# Patient Record
Sex: Female | Born: 2000 | Race: Black or African American | Hispanic: No | Marital: Single | Smoking: Never smoker
Health system: Southern US, Community
[De-identification: ages and names within clinical notes are randomized; demographics above are authoritative.]

## PROBLEM LIST (undated history)

## (undated) DIAGNOSIS — F32A Depression, unspecified: Secondary | ICD-10-CM

## (undated) DIAGNOSIS — F419 Anxiety disorder, unspecified: Secondary | ICD-10-CM

## (undated) DIAGNOSIS — F909 Attention-deficit hyperactivity disorder, unspecified type: Secondary | ICD-10-CM

## (undated) HISTORY — PX: BREAST BIOPSY: SHX20

---
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
## 2021-01-29 ENCOUNTER — Other Ambulatory Visit: Payer: Self-pay

## 2021-01-29 ENCOUNTER — Ambulatory Visit: Payer: BC Managed Care – PPO | Admitting: Physical Therapy

## 2021-01-29 ENCOUNTER — Encounter: Payer: Self-pay | Admitting: Physical Therapy

## 2021-01-29 DIAGNOSIS — R29898 Other symptoms and signs involving the musculoskeletal system: Secondary | ICD-10-CM

## 2021-01-29 DIAGNOSIS — M6281 Muscle weakness (generalized): Secondary | ICD-10-CM | POA: Diagnosis not present

## 2021-01-29 NOTE — Therapy (Signed)
[place], [place], [address] Phone: [phone]   Fax:  [phone]  Physical Therapy Evaluation  Patient Details  Name: Evelyn Booker MRN: [id number] Date of Birth: 2000/09/01 Referring Provider (PT): Park Meo   Encounter Date: 01/29/2021   PT End of Session - 01/29/21 1051     Visit Number 1    Number of Visits 6    Date for PT Re-Evaluation 03/12/21    PT Start Time 6333    PT Stop Time 1045    PT Time Calculation (min) 30 min    Activity Tolerance Patient tolerated treatment well    Behavior During Therapy [facility] for tasks assessed/performed             History reviewed. No pertinent past medical history.  History reviewed. No pertinent surgical history.  There were no vitals filed for this visit.    Subjective Assessment - 01/29/21 1018     Subjective Pt had a patellar dislocation about 2 years ago and only was able to complete a few sessions before "life got in the way". She states that she continues to be nervous that she may dislocate her patella again and wants to make sure she is doing all she can do to prevent that from happening. No c/o knee pain, she just feels some "popping" occasionally.    Pertinent History Patellar dislocation 2021    Diagnostic tests none recently    Patient Stated Goals improve strength to prevent another patellar dislocation    Currently in Pain? 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      Observation/Other Assessments   Focus on Therapeutic Outcomes (FOTO)  not performed      Functional Tests   Functional tests Squat;Single leg stance      Squat   Comments normal patellar tracking      Single Leg Stance   Comments > 30 sec bilat      ROM / Strength   AROM / PROM / Strength AROM;Strength      AROM   AROM Assessment Site Knee    Right/Left Knee Right;Left    Right Knee Extension 0    Right Knee Flexion 134    Left Knee Extension 0    Left Knee Flexion 135      Strength   Strength Assessment Site Knee;Hip    Right/Left Hip Right;Left    Right Hip Flexion 3+/5    Right Hip Extension 3+/5    Right Hip ABduction 3+/5    Left Hip Flexion 3+/5    Left Hip ABduction 4-/5    Right/Left Knee Right;Left    Right Knee Flexion 3+/5    Right Knee Extension 3+/5    Left Knee Flexion  3+/5      Flexibility   Soft Tissue Assessment /Muscle Length yes    Hamstrings decreased bilat    ITB decreased bilat      Palpation   Patella mobility Rt hypermobile                        Objective measurements completed on examination: See above findings.       [place] Adult PT Treatment/Exercise - 01/29/21 0001       Exercises   Exercises Knee/Hip      Knee/Hip Exercises: Stretches   Passive Hamstring Stretch Right;Left;30 seconds    Passive Hamstring Stretch Limitations with strap    ITB Stretch Left;Right;30 seconds    ITB Stretch Limitations with strap      Knee/Hip Exercises: Standing   Step Down Right;Hand Hold: 1;10 reps;Step Height: 6"    Step Down Limitations lateral    Other Standing Knee Exercises sidestep red TB 3 x 10 bilat      Knee/Hip Exercises: Supine   Straight Leg Raises 10 reps;Right    Straight Leg Raise with External Rotation Right;10 reps      Knee/Hip Exercises: Sidelying   Hip ABduction Right;10 reps                     PT Education - 01/29/21 1040      Education Details PT POC and goals, HEP    Person(s) Educated Patient    Methods Explanation;Demonstration;Handout    Comprehension Returned demonstration;Verbalized understanding                 PT Long Term Goals - 01/29/21 1101       PT LONG TERM GOAL #1   Title Pt will be independent in HEP    Time 6    Period Weeks    Status New    Target Date 03/12/21      PT LONG TERM GOAL #2   Title Pt will improve bilat LE strength to 4+/5 to reduce risk of patella dislocation    Time 6    Period Weeks    Status New    Target Date 03/12/21                    Plan - 01/29/21 1052     Clinical Impression Statement Pt is a 20 y/o female referred s/p Rt patella dislocation. Pt presents with decreased hip and knee strength and flexibility and impaired coordination and will benefit from skilled PT to address deficits and improve functional mobility    Personal Factors and Comorbidities Time since onset of injury/illness/exacerbation;Past/Current Experience    Examination-Participation Restrictions Occupation;Community Activity    Stability/Clinical Decision Making Stable/Uncomplicated    Clinical Decision Making Low    Rehab Potential Good    PT Frequency 1x / week    PT Duration 6 weeks    PT Treatment/Interventions Taping;Dry needling;Manual techniques;Patient/family education;Therapeutic exercise;Therapeutic activities;Balance training;Neuromuscular re-education;Gait training;Stair training;Moist Heat;Cryotherapy;Vasopneumatic Device;Aquatic Therapy;Iontophoresis 4mg /ml Dexamethasone;Electrical Stimulation    PT Next Visit Plan assess and progress HEP    PT Home Exercise Plan [email or web address]    Consulted and Agree with Plan of Care Patient             Patient will benefit from skilled therapeutic intervention in order to improve the following deficits and impairments:  Impaired flexibility, Decreased strength  Visit Diagnosis: Muscle weakness (generalized) - Plan: PT  plan of care cert/re-cert  Other symptoms and signs involving the musculoskeletal system - Plan: PT plan of care cert/re-cert     Problem List Patient Active Problem List   Diagnosis Date Noted   Hypermobility syndrome 10/11/2018   Dislocation of right patella 10/11/2018    Evelyn Booker, PT 01/29/2021, 11:05 AM  [facility] [place], [place], [address] Phone: [phone]   Fax:  [phone]  Name: Evelyn Booker MRN: [id number] Date of Birth: 2000-05-06

## 2021-01-29 NOTE — Patient Instructions (Signed)
Access Code: [email or web address] URL: [email or web address] Date: 01/29/2021 Prepared by: Isabelle Course  Exercises Hooklying Hamstring Stretch with Strap - 1 x daily - 7 x weekly - 3 sets - 1 reps - 20-30 seconds hold Supine ITB Stretch with Strap - 1 x daily - 7 x weekly - 3 sets - 1 reps - 20-30 seconds hold Active Straight Leg Raise with Quad Set - 1 x daily - 7 x weekly - 3 sets - 10 reps Straight Leg Raise with External Rotation - 1 x daily - 7 x weekly - 3 sets - 10 reps Sidelying Hip Abduction - 1 x daily - 7 x weekly - 3 sets - 10 reps Side Stepping with Resistance at Feet - 1 x daily - 7 x weekly - 3 sets - 10 reps Lateral Step Down - 1 x daily - 7 x weekly - 3 sets - 10 reps

## 2021-02-11 ENCOUNTER — Encounter: Payer: BC Managed Care – PPO | Admitting: Physical Therapy

## 2021-07-05 IMAGING — DX DG KNEE COMPLETE 4+V*R*
4 series · 4 of 4 positions shown · non-contrast
Comparison: None.

CLINICAL DATA: Acute RIGHT knee pain following fall. Reported
patellar dislocation.

EXAM:
RIGHT KNEE - COMPLETE 4+ VIEW

[knee ap]
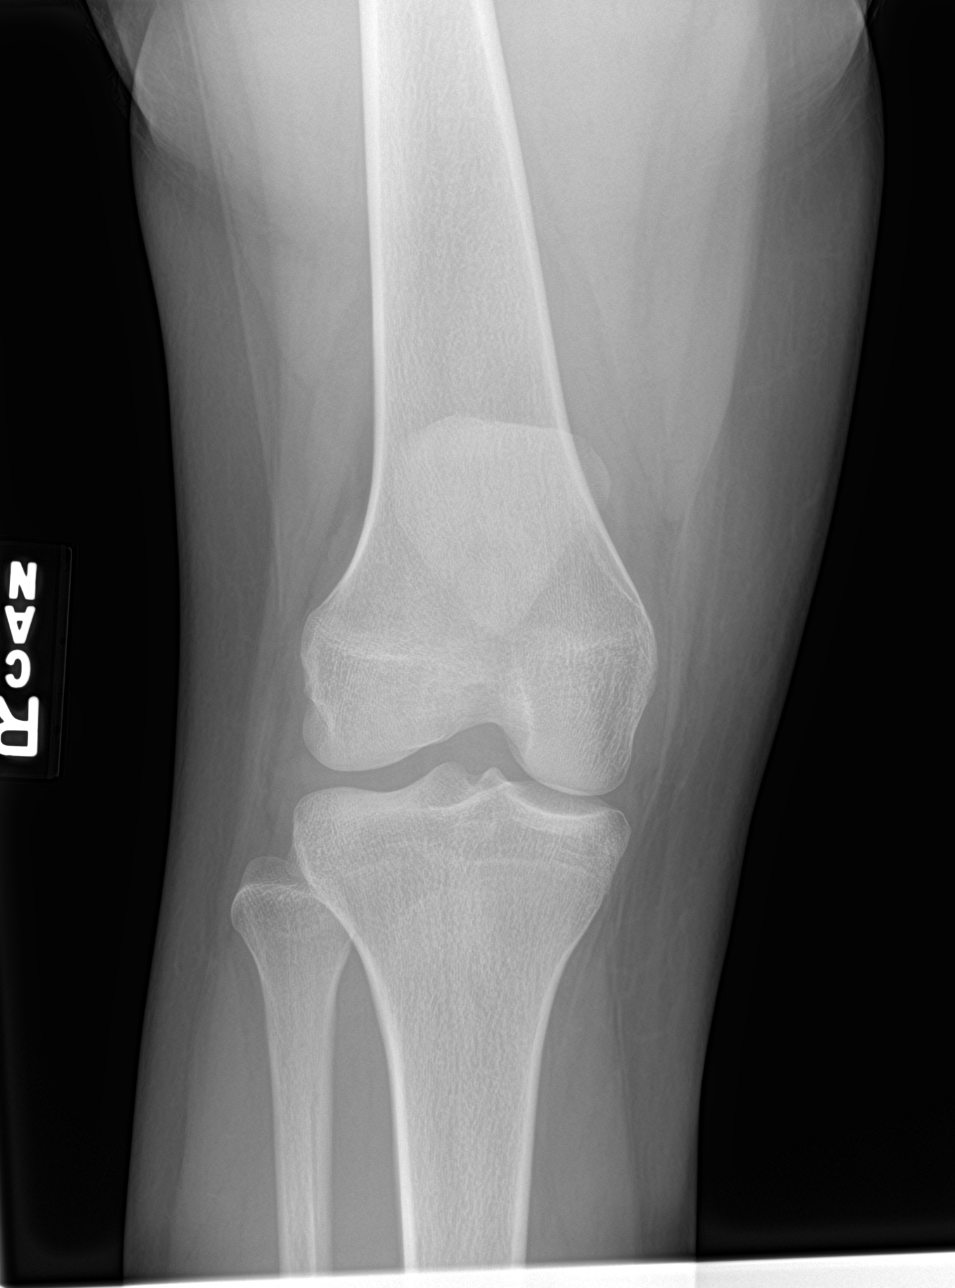

[knee lat]
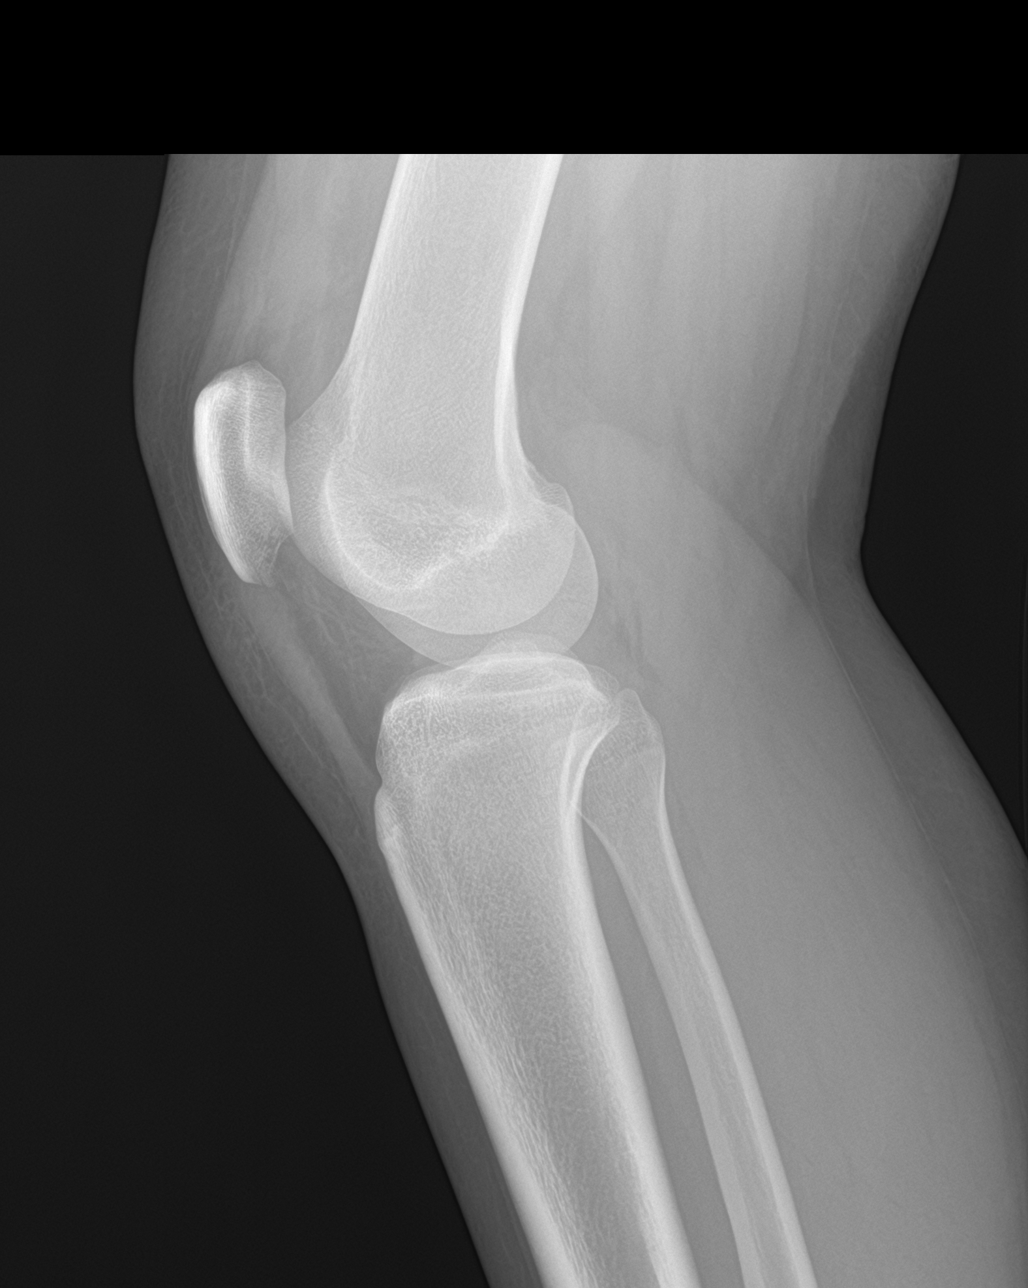

[knee obl (1 of 2)]
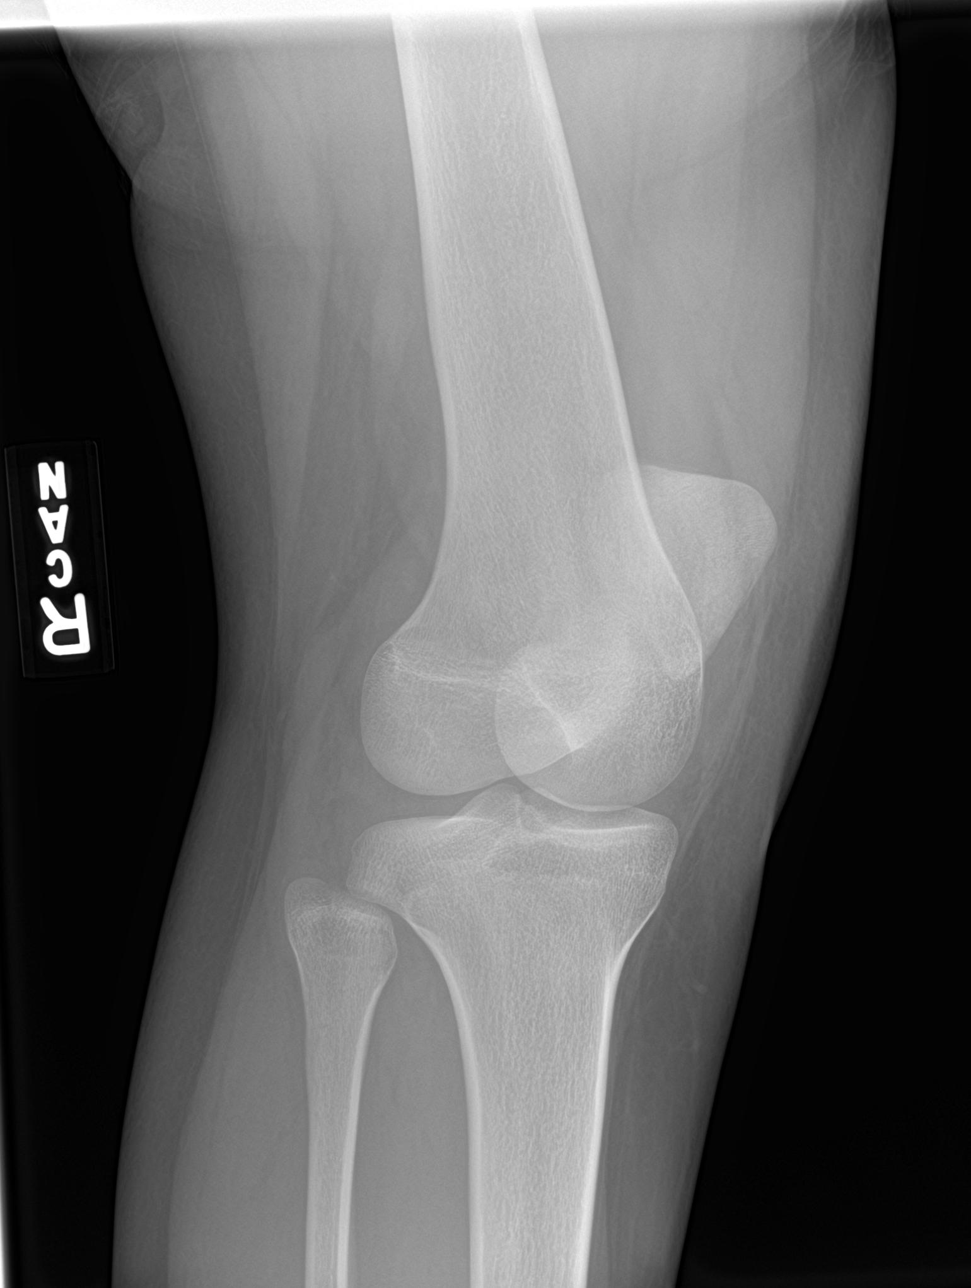

[knee obl (2 of 2)]
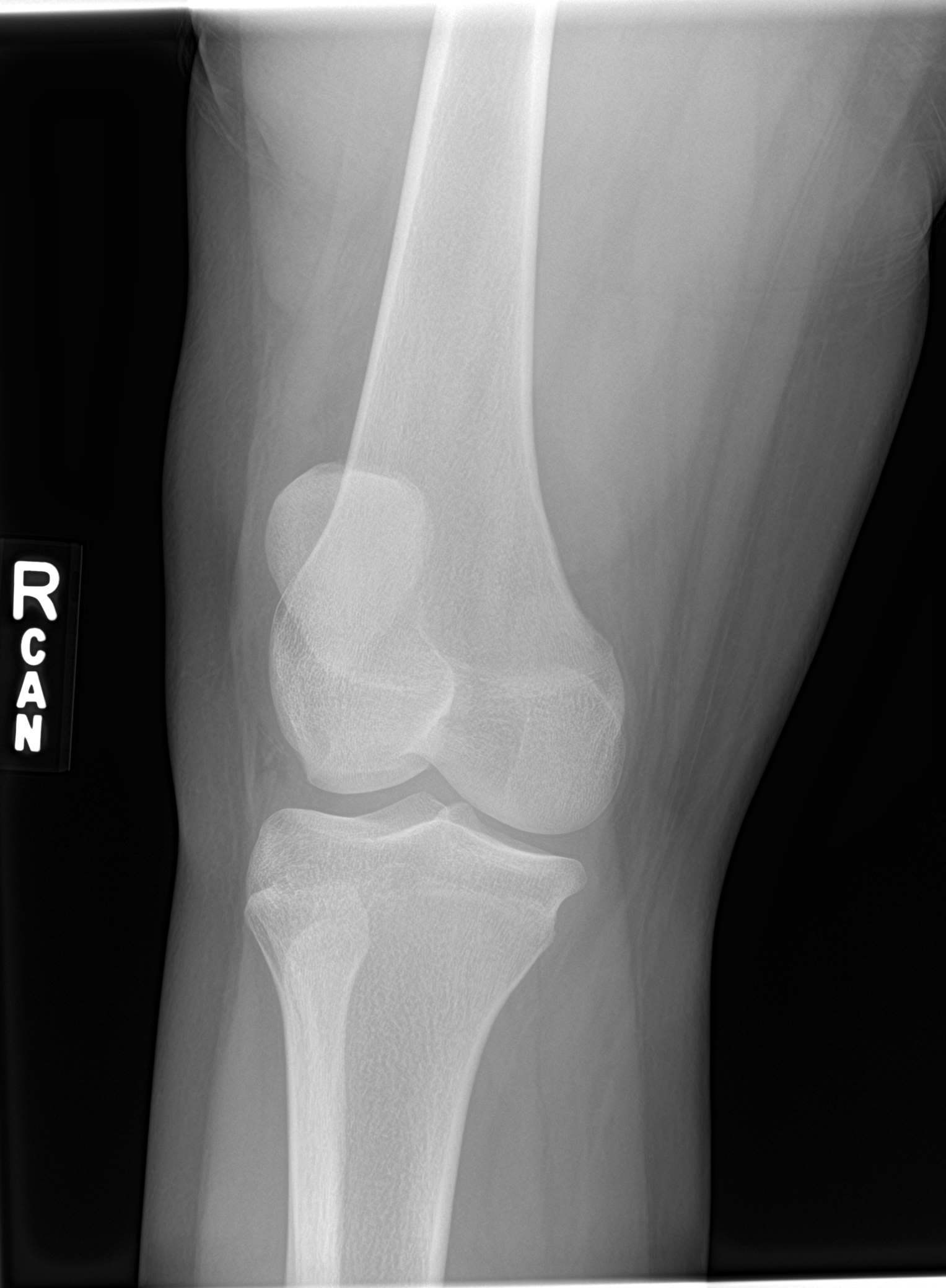

[4 of 4 positions shown; findings below may reference images not displayed]

FINDINGS: No fracture or definite dislocation identified on this study.

A knee effusion is present.

No focal bony abnormalities are present.
IMPRESSION: Knee effusion without definite acute bony abnormality.

## 2021-07-06 IMAGING — DX DG KNEE 1-2V*R*
1 series · 2 of 2 positions shown · non-contrast
Comparison: 10/10/2018

CLINICAL DATA: Knee pain

EXAM:
RIGHT KNEE - 1-2 VIEW

[Series 4: knee sunrise · 0.14mm/px · 2 of 2 slices shown]
[im 1/2]
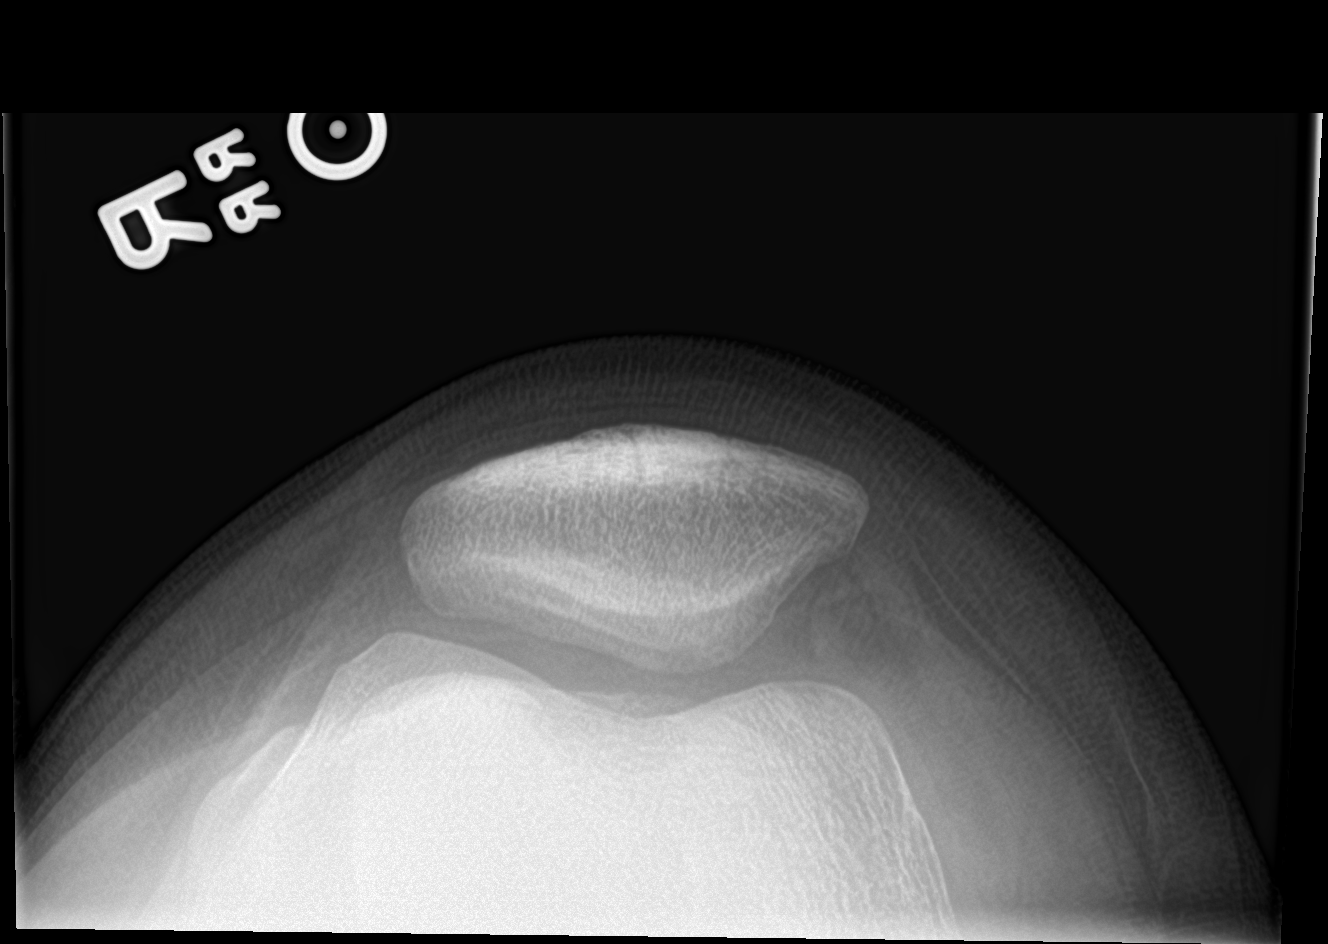
[im 2/2]
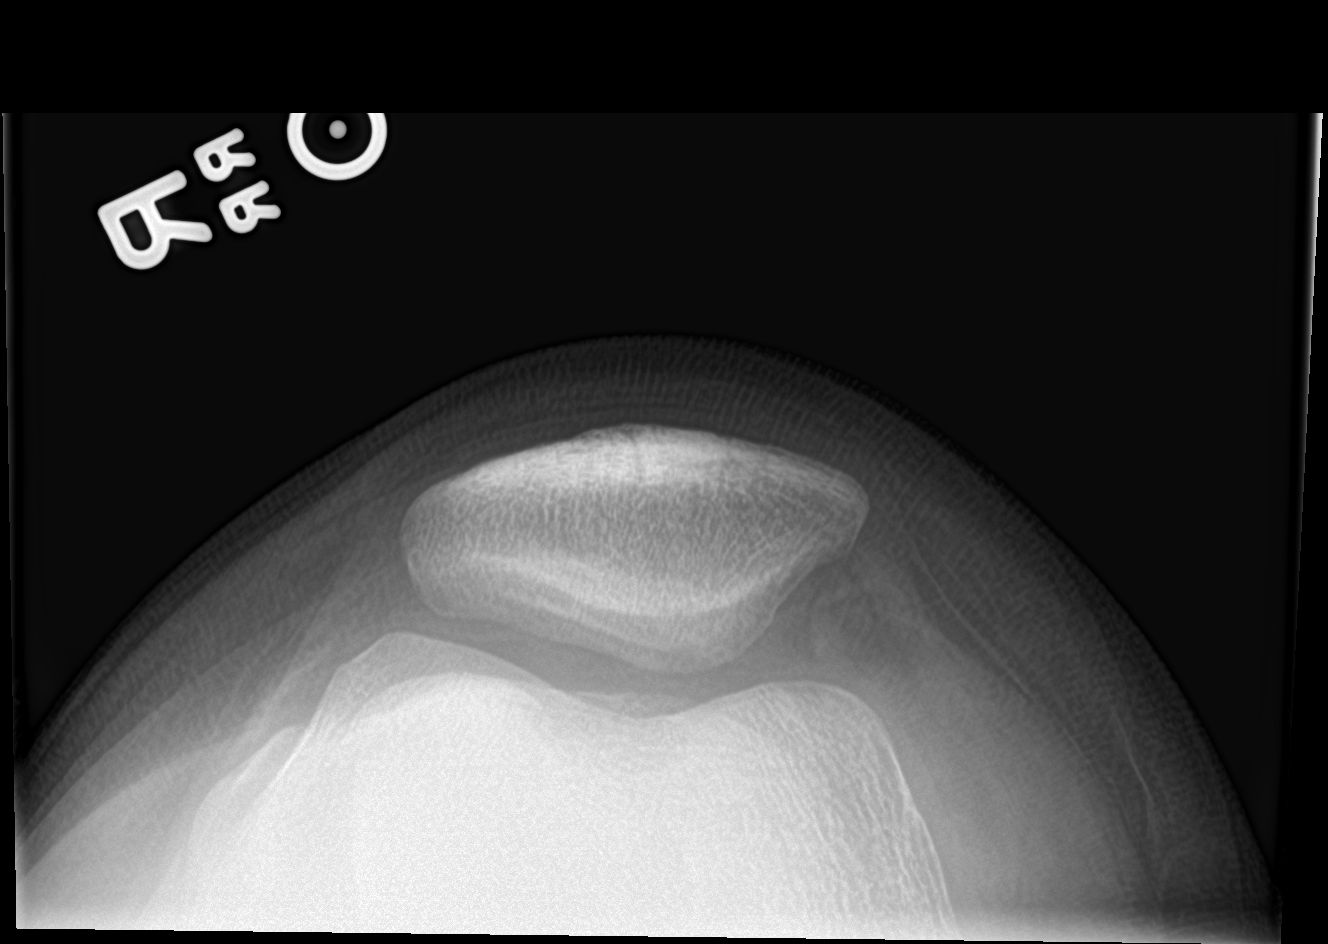

[2 of 2 positions shown; findings below may reference images not displayed]

FINDINGS: Sunrise views of the patella.  Alignment is normal.  No dislocation
IMPRESSION: Negative.

## 2022-05-26 ENCOUNTER — Encounter: Payer: Self-pay | Admitting: *Deleted

## 2022-06-01 ENCOUNTER — Emergency Department (EMERGENCY_DEPARTMENT_HOSPITAL)
Admission: EM | Admit: 2022-06-01 | Discharge: 2022-06-02 | Disposition: A | Discharge status: Other Acute Inpatient Hospital | Payer: BC Managed Care – PPO | Source: Home / Self Care | Attending: Emergency Medicine | Admitting: Emergency Medicine

## 2022-06-01 DIAGNOSIS — R45851 Suicidal ideations: Secondary | ICD-10-CM | POA: Insufficient documentation

## 2022-06-01 DIAGNOSIS — T1491XA Suicide attempt, initial encounter: Secondary | ICD-10-CM | POA: Diagnosis not present

## 2022-06-01 DIAGNOSIS — Z20822 Contact with and (suspected) exposure to covid-19: Secondary | ICD-10-CM | POA: Insufficient documentation

## 2022-06-01 DIAGNOSIS — F439 Reaction to severe stress, unspecified: Secondary | ICD-10-CM | POA: Insufficient documentation

## 2022-06-01 DIAGNOSIS — R45850 Homicidal ideations: Secondary | ICD-10-CM | POA: Insufficient documentation

## 2022-06-01 DIAGNOSIS — F332 Major depressive disorder, recurrent severe without psychotic features: Secondary | ICD-10-CM | POA: Diagnosis present

## 2022-06-01 DIAGNOSIS — F3181 Bipolar II disorder: Secondary | ICD-10-CM | POA: Diagnosis not present

## 2022-06-01 DIAGNOSIS — E876 Hypokalemia: Secondary | ICD-10-CM | POA: Insufficient documentation

## 2022-06-01 DIAGNOSIS — F411 Generalized anxiety disorder: Secondary | ICD-10-CM | POA: Diagnosis present

## 2022-06-01 DIAGNOSIS — F419 Anxiety disorder, unspecified: Secondary | ICD-10-CM | POA: Diagnosis present

## 2022-06-02 ENCOUNTER — Inpatient Hospital Stay (HOSPITAL_COMMUNITY)
Admission: AD | Admit: 2022-06-02 | Discharge: 2022-06-09 | DRG: 885 | Disposition: A | Discharge status: Home / Self Care | Payer: BC Managed Care – PPO | Source: Intra-hospital | Attending: Psychiatry | Admitting: Psychiatry

## 2022-06-02 ENCOUNTER — Encounter (HOSPITAL_COMMUNITY): Payer: Self-pay | Admitting: Psychiatry

## 2022-06-02 ENCOUNTER — Encounter (HOSPITAL_COMMUNITY): Payer: Self-pay | Admitting: *Deleted

## 2022-06-02 ENCOUNTER — Other Ambulatory Visit: Payer: Self-pay

## 2022-06-02 DIAGNOSIS — R45851 Suicidal ideations: Secondary | ICD-10-CM | POA: Diagnosis present

## 2022-06-02 DIAGNOSIS — K30 Functional dyspepsia: Secondary | ICD-10-CM | POA: Diagnosis present

## 2022-06-02 DIAGNOSIS — F411 Generalized anxiety disorder: Secondary | ICD-10-CM | POA: Diagnosis present

## 2022-06-02 DIAGNOSIS — R000 Tachycardia, unspecified: Secondary | ICD-10-CM | POA: Diagnosis present

## 2022-06-02 DIAGNOSIS — T1491XA Suicide attempt, initial encounter: Secondary | ICD-10-CM | POA: Diagnosis not present

## 2022-06-02 DIAGNOSIS — Z20822 Contact with and (suspected) exposure to covid-19: Secondary | ICD-10-CM | POA: Diagnosis present

## 2022-06-02 DIAGNOSIS — Z79899 Other long term (current) drug therapy: Secondary | ICD-10-CM | POA: Diagnosis not present

## 2022-06-02 DIAGNOSIS — R45850 Homicidal ideations: Secondary | ICD-10-CM | POA: Diagnosis present

## 2022-06-02 DIAGNOSIS — F3181 Bipolar II disorder: Secondary | ICD-10-CM | POA: Diagnosis not present

## 2022-06-02 DIAGNOSIS — G4700 Insomnia, unspecified: Secondary | ICD-10-CM | POA: Diagnosis present

## 2022-06-02 DIAGNOSIS — F909 Attention-deficit hyperactivity disorder, unspecified type: Secondary | ICD-10-CM | POA: Diagnosis present

## 2022-06-02 DIAGNOSIS — F332 Major depressive disorder, recurrent severe without psychotic features: Secondary | ICD-10-CM | POA: Diagnosis present

## 2022-06-02 DIAGNOSIS — K5900 Constipation, unspecified: Secondary | ICD-10-CM | POA: Diagnosis present

## 2022-06-02 DIAGNOSIS — F419 Anxiety disorder, unspecified: Secondary | ICD-10-CM | POA: Diagnosis present

## 2022-06-02 DIAGNOSIS — R454 Irritability and anger: Secondary | ICD-10-CM | POA: Diagnosis present

## 2022-06-02 HISTORY — DX: Depression, unspecified: F32.A

## 2022-06-02 HISTORY — DX: Attention-deficit hyperactivity disorder, unspecified type: F90.9

## 2022-06-02 HISTORY — DX: Anxiety disorder, unspecified: F41.9

## 2022-06-02 LAB — COMPREHENSIVE METABOLIC PANEL
ALT: 9 U/L (ref 0–44)
AST: 15 U/L (ref 15–41)
Albumin: 4 g/dL (ref 3.5–5.0)
Alkaline Phosphatase: 42 U/L (ref 38–126)
Anion gap: 10 (ref 5–15)
BUN: 7 mg/dL (ref 6–20)
CO2: 21 mmol/L — ABNORMAL LOW (ref 22–32)
Calcium: 9 mg/dL (ref 8.9–10.3)
Chloride: 105 mmol/L (ref 98–111)
Creatinine, Ser: 0.81 mg/dL (ref 0.44–1.00)
GFR, Estimated: >60 mL/min (ref >60)
Glucose, Bld: 137 mg/dL — ABNORMAL HIGH (ref 70–99)
Potassium: 3.3 mmol/L — ABNORMAL LOW (ref 3.5–5.1)
Sodium: 136 mmol/L (ref 135–145)
Total Bilirubin: 0.8 mg/dL (ref 0.3–1.2)
Total Protein: 7.1 g/dL (ref 6.5–8.1)

## 2022-06-02 LAB — CBC WITH DIFFERENTIAL/PLATELET
Abs Immature Granulocytes: 0.02 10*3/uL (ref 0.00–0.07)
Basophils Absolute: 0 10*3/uL (ref 0.0–0.1)
Basophils Relative: 1 %
Eosinophils Absolute: 0.1 10*3/uL (ref 0.0–0.5)
Eosinophils Relative: 1 %
HCT: 39.8 % (ref 36.0–46.0)
Hemoglobin: 12.2 g/dL (ref 12.0–15.0)
Immature Granulocytes: 0 %
Lymphocytes Relative: 24 %
Lymphs Abs: 1.4 10*3/uL (ref 0.7–4.0)
MCH: 26.9 pg (ref 26.0–34.0)
MCHC: 30.7 g/dL (ref 30.0–36.0)
MCV: 87.9 fL (ref 80.0–100.0)
Monocytes Absolute: 0.4 10*3/uL (ref 0.1–1.0)
Monocytes Relative: 7 %
Neutro Abs: 3.9 10*3/uL (ref 1.7–7.7)
Neutrophils Relative %: 67 %
Platelets: 235 10*3/uL (ref 150–400)
RBC: 4.53 MIL/uL (ref 3.87–5.11)
RDW: 13.3 % (ref 11.5–15.5)
WBC: 5.7 10*3/uL (ref 4.0–10.5)
nRBC: 0 % (ref 0.0–0.2)

## 2022-06-02 LAB — COOXEMETRY PANEL
Carboxyhemoglobin: <0.3 % — ABNORMAL LOW (ref 0.5–1.5)
Methemoglobin: <0.7 % (ref 0.0–1.5)
O2 Saturation: 27.7 %
Total hemoglobin: 12.8 g/dL (ref 12.0–16.0)

## 2022-06-02 LAB — SARS CORONAVIRUS 2 BY RT PCR: SARS Coronavirus 2 by RT PCR: NEGATIVE

## 2022-06-02 LAB — ACETAMINOPHEN LEVEL: Acetaminophen (Tylenol), Serum: <10 ug/mL — ABNORMAL LOW (ref 10–30)

## 2022-06-02 LAB — RAPID URINE DRUG SCREEN, HOSP PERFORMED
Amphetamines: NOT DETECTED
Barbiturates: NOT DETECTED
Benzodiazepines: NOT DETECTED
Cocaine: NOT DETECTED
Opiates: NOT DETECTED
Tetrahydrocannabinol: POSITIVE — AB

## 2022-06-02 LAB — HCG, QUANTITATIVE, PREGNANCY: hCG, Beta Chain, Quant, S: <1 m[IU]/mL (ref <5)

## 2022-06-02 LAB — SALICYLATE LEVEL: Salicylate Lvl: <7 mg/dL — ABNORMAL LOW (ref 7.0–30.0)

## 2022-06-02 LAB — ETHANOL: Alcohol, Ethyl (B): <10 mg/dL (ref <10)

## 2022-06-02 MED ORDER — MAGNESIUM HYDROXIDE 400 MG/5ML PO SUSP
30.0000 mL | Freq: Every day | ORAL | Status: DC | PRN
  Administered 2022-06-06 – 2022-06-07 (×2): 30 mL via ORAL
  Filled 2022-06-02 (×3): qty 30

## 2022-06-02 MED ORDER — ACETAMINOPHEN 325 MG PO TABS
650.0000 mg | ORAL_TABLET | Freq: Four times a day (QID) | ORAL | Status: DC | PRN
  Administered 2022-06-02 – 2022-06-03 (×2): 650 mg via ORAL
  Filled 2022-06-02 (×3): qty 2

## 2022-06-02 MED ORDER — HALOPERIDOL 5 MG PO TABS: 5.0000 mg | ORAL_TABLET | Freq: Three times a day (TID) | ORAL | Status: DC | PRN

## 2022-06-02 MED ORDER — ESCITALOPRAM OXALATE 10 MG PO TABS
5.0000 mg | ORAL_TABLET | Freq: Every day | ORAL | Status: DC
  Administered 2022-06-02: 5 mg via ORAL
  Filled 2022-06-02: qty 1

## 2022-06-02 MED ORDER — HYDROXYZINE HCL 10 MG PO TABS: 10.0000 mg | ORAL_TABLET | Freq: Three times a day (TID) | ORAL | Status: DC | PRN

## 2022-06-02 MED ORDER — ALUM & MAG HYDROXIDE-SIMETH 200-200-20 MG/5ML PO SUSP
30.0000 mL | ORAL | Status: DC | PRN
  Administered 2022-06-06: 30 mL via ORAL
  Filled 2022-06-02: qty 30

## 2022-06-02 MED ORDER — DIPHENHYDRAMINE HCL 25 MG PO CAPS: 50.0000 mg | ORAL_CAPSULE | Freq: Three times a day (TID) | ORAL | Status: DC | PRN

## 2022-06-02 MED ORDER — DIPHENHYDRAMINE HCL 50 MG/ML IJ SOLN: 50.0000 mg | Freq: Three times a day (TID) | INTRAMUSCULAR | Status: DC | PRN

## 2022-06-02 MED ORDER — TRAZODONE HCL 50 MG PO TABS
50.0000 mg | ORAL_TABLET | Freq: Every evening | ORAL | Status: DC | PRN
  Administered 2022-06-03 – 2022-06-08 (×5): 50 mg via ORAL
  Filled 2022-06-02 (×6): qty 1

## 2022-06-02 MED ORDER — HALOPERIDOL LACTATE 5 MG/ML IJ SOLN: 5.0000 mg | Freq: Three times a day (TID) | INTRAMUSCULAR | Status: DC | PRN

## 2022-06-02 MED ORDER — LORAZEPAM 2 MG/ML IJ SOLN: 2.0000 mg | Freq: Three times a day (TID) | INTRAMUSCULAR | Status: DC | PRN

## 2022-06-02 MED ORDER — HYDROXYZINE HCL 25 MG PO TABS
25.0000 mg | ORAL_TABLET | Freq: Three times a day (TID) | ORAL | Status: DC | PRN
  Administered 2022-06-03 – 2022-06-08 (×9): 25 mg via ORAL
  Filled 2022-06-02 (×10): qty 1

## 2022-06-02 MED ORDER — WHITE PETROLATUM EX OINT
TOPICAL_OINTMENT | CUTANEOUS | Status: AC
  Filled 2022-06-02: qty 5

## 2022-06-02 MED ORDER — LORAZEPAM 1 MG PO TABS: 2.0000 mg | ORAL_TABLET | Freq: Three times a day (TID) | ORAL | Status: DC | PRN

## 2022-06-02 NOTE — ED Notes (Signed)
EKG being performed on pt for transfer to [facility]

## 2022-06-02 NOTE — Plan of Care (Signed)
  Problem: Education: Goal: Ability to make informed decisions regarding treatment will improve Outcome: Progressing   Problem: Coping: Goal: Coping ability will improve Outcome: Progressing   Problem: Health Behavior/Discharge Planning: Goal: Identification of resources available to assist in meeting health care needs will improve Outcome: Progressing   Problem: Medication: Goal: Compliance with prescribed medication regimen will improve Outcome: Progressing   Problem: Self-Concept: Goal: Ability to disclose and discuss suicidal ideas will improve Outcome: Progressing Goal: Will verbalize positive feelings about self Outcome: Progressing   
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
## 2022-06-02 NOTE — ED Notes (Signed)
Pt has been changed into blue paper scrubs since we didn't have any burgundy scrubs. Pt has two belonging bags one with her clothes and shoes, the other one has her carry on bag. Pt has been wanded by security.
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
## 2022-06-02 NOTE — ED Triage Notes (Signed)
Pt arrived with a friend. Reports over the last 2 weeks has had suicidal thoughts. Tonight took a gas heater into her car and turned the heater on in attempt to commit suicide. Denies new medications or psychiatric admissions; however, is hesitant about new stressors in regards to attempt. No complaints regarding carbon monoxide

## 2022-06-02 NOTE — Group Note (Signed)
LCSW Group Therapy Note   Group Date: 06/02/2022 Start Time: 1100 End Time: 1200     Type of Therapy and Topic:  Group Therapy:  Goal Exploration   Participation Level:  Did Not Attend     Description of Group: In this process group, patients discussed using strengths to work toward goals and address challenges.  Patients identified two positive things about themselves and one goal they were working on.  Patients were given the opportunity to share openly and support each other's plan for self-empowerment.  The group discussed the value of gratitude and were encouraged to have a daily reflection of positive characteristics or circumstances.  Patients were encouraged to identify a plan to utilize their strengths to work on current challenges and goals.   Therapeutic Goals Patient will verbalize personal strengths/positive qualities and relate how these can assist with achieving desired personal goals Patients will verbalize affirmation of peers plans for personal change and goal setting Patients will explore the value of gratitude and positive focus as related to successful achievement of goals Patients will verbalize a plan for regular reinforcement of personal positive qualities and circumstances.   Summary of Patient Progress:  Did Not Attend        Therapeutic Modalities Cognitive Behavioral Therapy Motivational Interviewing     Evelyn Booker 06/02/2022  3:16 PM

## 2022-06-02 NOTE — Progress Notes (Signed)
Pt was accepted to [facility] TODAY 06/02/2022, pending EKG and signed voluntary consent faxed to [phone]. Bed assignment: 405-1  Pt meets inpatient criteria per Dahlia Byes, NP  Attending Physician will be Phineas Inches, MD  Report can be called to: - Adult unit: [phone]  Pt can arrive after pending items are received  Care Team Notified: [facility] Rona Ravens, RN, Dahlia Byes, NP, Will Phillips Odor, RN, and [facility], NT  [place], [place]  06/02/2022 11:11 AM
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
## 2022-06-02 NOTE — Group Note (Signed)
LCSW Group Therapy Note  Group Date: 06/02/2022 Start Time: 1100 End Time: 1155   Type of Therapy and Topic:  Group Therapy - How To Cope with Nervousness about Discharge   Participation Level:  Active   Description of Group This process group involved identification of patients' feelings about discharge. Some of them are scheduled to be discharged soon, while others are new admissions, but each of them was asked to share thoughts and feelings surrounding discharge from the hospital. One common theme was that they are excited at the prospect of going home, while another was that many of them are apprehensive about sharing why they were hospitalized. Patients were given the opportunity to discuss these feelings with their peers in preparation for discharge.  Therapeutic Goals  Patient will identify their overall feelings about pending discharge. Patient will think about how they might proactively address issues that they believe will once again arise once they get home (i.e. with parents). Patients will participate in discussion about having hope for change.   Summary of Patient Progress:  Evelyn Booker was very active throughout the session. Evelyn Booker demonstrated proper insight into the subject matter, and proved open to input from peers and feedback from CSW. Evelyn Booker was respectful of peers and participated throughout the entire session.   Therapeutic Modalities Cognitive Behavioral Therapy   Ane Payment, LCSW 06/02/2022  1:16 PM

## 2022-06-03 ENCOUNTER — Encounter (HOSPITAL_COMMUNITY): Payer: Self-pay

## 2022-06-03 DIAGNOSIS — R454 Irritability and anger: Secondary | ICD-10-CM | POA: Diagnosis present

## 2022-06-03 DIAGNOSIS — G4700 Insomnia, unspecified: Secondary | ICD-10-CM | POA: Diagnosis present

## 2022-06-03 DIAGNOSIS — F3181 Bipolar II disorder: Secondary | ICD-10-CM | POA: Diagnosis not present

## 2022-06-03 MED ORDER — POTASSIUM CHLORIDE CRYS ER 20 MEQ PO TBCR
40.0000 meq | EXTENDED_RELEASE_TABLET | Freq: Once | ORAL | Status: AC
  Administered 2022-06-03: 40 meq via ORAL
  Filled 2022-06-03 (×2): qty 2

## 2022-06-03 MED ORDER — FLUOXETINE HCL 10 MG PO CAPS
10.0000 mg | ORAL_CAPSULE | Freq: Every day | ORAL | Status: DC
  Administered 2022-06-04 – 2022-06-08 (×5): 10 mg via ORAL
  Filled 2022-06-03 (×7): qty 1

## 2022-06-03 MED ORDER — ARIPIPRAZOLE 5 MG PO TABS
5.0000 mg | ORAL_TABLET | Freq: Every day | ORAL | Status: DC
  Administered 2022-06-03 – 2022-06-09 (×7): 5 mg via ORAL
  Filled 2022-06-03 (×9): qty 1

## 2022-06-03 MED ORDER — LAMOTRIGINE 25 MG PO TABS
25.0000 mg | ORAL_TABLET | Freq: Every day | ORAL | Status: DC
  Administered 2022-06-04 – 2022-06-05 (×2): 25 mg via ORAL
  Filled 2022-06-03 (×4): qty 1

## 2022-06-03 NOTE — Progress Notes (Signed)
Patient endorses passive SI with no plan or intent. Denies HI and A/V/H. Patient states her anxiety is a 1 out of 10 and depression 8 out of 10. Patient med compliant and cooperative on unit.      06/03/22 1016  Psych Admission Type (Psych Patients Only)  Admission Status Voluntary  Psychosocial Assessment  Patient Complaints Depression  Eye Contact Fair  Facial Expression Anxious  Affect Depressed  Speech Logical/coherent  Interaction Assertive  Motor Activity Fidgety  Appearance/Hygiene Unremarkable  Behavior Characteristics Cooperative  Mood Depressed  Thought Process  Coherency WDL  Content WDL  Delusions None reported or observed  Perception WDL  Hallucination None reported or observed  Judgment Poor  Confusion None  Danger to Self  Current suicidal ideation? Passive  Agreement Not to Harm Self Yes  Description of Agreement verbally contracts for safety  Danger to Others  Danger to Others None reported or observed

## 2022-06-03 NOTE — BH IP Treatment Plan (Signed)
Interdisciplinary Treatment and Diagnostic Plan Update  06/03/2022 Time of Session: 10:45 AM  Evelyn Booker MRN: [id number]  Principal Diagnosis: MDD (major depressive disorder), recurrent episode, severe  Secondary Diagnoses: Principal Problem:   MDD (major depressive disorder), recurrent episode, severe   Current Medications:  Current Facility-Administered Medications  Medication Dose Route Frequency Provider Last Rate Last Admin   acetaminophen (TYLENOL) tablet 650 mg  650 mg Oral Q6H PRN Starleen Blue, NP   650 mg at 06/03/22 1013   alum & mag hydroxide-simeth (MAALOX/MYLANTA) 200-200-20 MG/5ML suspension 30 mL  30 mL Oral Q4H PRN Nkwenti, Doris, NP       diphenhydrAMINE (BENADRYL) capsule 50 mg  50 mg Oral TID PRN Starleen Blue, NP       Or   diphenhydrAMINE (BENADRYL) injection 50 mg  50 mg Intramuscular TID PRN Starleen Blue, NP       haloperidol (HALDOL) tablet 5 mg  5 mg Oral TID PRN Starleen Blue, NP       Or   haloperidol lactate (HALDOL) injection 5 mg  5 mg Intramuscular TID PRN Starleen Blue, NP       hydrOXYzine (ATARAX) tablet 25 mg  25 mg Oral TID PRN Starleen Blue, NP   25 mg at 06/03/22 0026   LORazepam (ATIVAN) tablet 2 mg  2 mg Oral TID PRN Starleen Blue, NP       Or   LORazepam (ATIVAN) injection 2 mg  2 mg Intramuscular TID PRN Starleen Blue, NP       magnesium hydroxide (MILK OF MAGNESIA) suspension 30 mL  30 mL Oral Daily PRN Starleen Blue, NP       traZODone (DESYREL) tablet 50 mg  50 mg Oral QHS PRN Starleen Blue, NP   50 mg at 06/03/22 0025   PTA Medications: Medications Prior to Admission  Medication Sig Dispense Refill Last Dose   guanFACINE (INTUNIV) 2 MG TB24 ER tablet Take 2 mg by mouth daily.   06/01/2022   levonorgestrel-ethinyl estradiol (ALESSE) 0.1-20 MG-MCG tablet Take 1 tablet by mouth daily.   06/01/2022    Patient Stressors:    Patient Strengths:    Treatment Modalities: Medication Management, Group therapy, Case management,  1  to 1 session with clinician, Psychoeducation, Recreational therapy.   Physician Treatment Plan for Primary Diagnosis: MDD (major depressive disorder), recurrent episode, severe Long Term Goal(s):     Short Term Goals:    Medication Management: Evaluate patient's response, side effects, and tolerance of medication regimen.  Therapeutic Interventions: 1 to 1 sessions, Unit Group sessions and Medication administration.  Evaluation of Outcomes: Not Progressing  Physician Treatment Plan for Secondary Diagnosis: Principal Problem:   MDD (major depressive disorder), recurrent episode, severe  Long Term Goal(s):     Short Term Goals:       Medication Management: Evaluate patient's response, side effects, and tolerance of medication regimen.  Therapeutic Interventions: 1 to 1 sessions, Unit Group sessions and Medication administration.  Evaluation of Outcomes: Not Progressing   RN Treatment Plan for Primary Diagnosis: MDD (major depressive disorder), recurrent episode, severe Long Term Goal(s): Knowledge of disease and therapeutic regimen to maintain health will improve  Short Term Goals: Ability to remain free from injury will improve, Ability to verbalize frustration and anger appropriately will improve, Ability to demonstrate self-control, Ability to participate in decision making will improve, Ability to verbalize feelings will improve, Ability to disclose and discuss suicidal ideas, Ability to identify and develop effective coping behaviors will improve, and  Compliance with prescribed medications will improve  Medication Management: RN will administer medications as ordered by provider, will assess and evaluate patient's response and provide education to patient for prescribed medication. RN will report any adverse and/or side effects to prescribing provider.  Therapeutic Interventions: 1 on 1 counseling sessions, Psychoeducation, Medication administration, Evaluate responses to  treatment, Monitor vital signs and CBGs as ordered, Perform/monitor CIWA, COWS, AIMS and Fall Risk screenings as ordered, Perform wound care treatments as ordered.  Evaluation of Outcomes: Not Progressing   LCSW Treatment Plan for Primary Diagnosis: MDD (major depressive disorder), recurrent episode, severe Long Term Goal(s): Safe transition to appropriate next level of care at discharge, Engage patient in therapeutic group addressing interpersonal concerns.  Short Term Goals: Engage patient in aftercare planning with referrals and resources, Increase social support, Increase ability to appropriately verbalize feelings, Increase emotional regulation, Facilitate acceptance of mental health diagnosis and concerns, Facilitate patient progression through stages of change regarding substance use diagnoses and concerns, Identify triggers associated with mental health/substance abuse issues, and Increase skills for wellness and recovery  Therapeutic Interventions: Assess for all discharge needs, 1 to 1 time with Social worker, Explore available resources and support systems, Assess for adequacy in community support network, Educate family and significant other(s) on suicide prevention, Complete Psychosocial Assessment, Interpersonal group therapy.  Evaluation of Outcomes: Not Progressing   Progress in Treatment: Attending groups: No. Participating in groups: No. Taking medication as prescribed: Yes. Toleration medication: Yes. Family/Significant other contact made: No, will contact:  Mom  Patient understands diagnosis: Yes. Discussing patient identified problems/goals with staff: Yes. Medical problems stabilized or resolved: Yes. Denies suicidal/homicidal ideation: Yes. Issues/concerns per patient self-inventory: No.   New problem(s) identified: No, Describe:  None reported   New Short Term/Long Term Goal(s): medication stabilization, elimination of SI thoughts, development of comprehensive  mental wellness plan.    Patient Goals:  " figure out what is wrong with me and how to fix my anger issues , learn coping skills, and get my medications adjusted "   Discharge Plan or Barriers: Patient recently admitted. CSW will continue to follow and assess for appropriate referrals and possible discharge planning.    Reason for Continuation of Hospitalization: Anxiety Depression Medication stabilization Suicidal ideation  Estimated Length of Stay: 3-5 days   Last 3 [place] Suicide Severity Risk Score: Flowsheet Row Ad[facility] (Current) [facility] from 06[facility]01/2022 in [facility] Emergency Department at [facility]  C-SSRS RISK CATEGORY High Risk High Risk       Last [facility] 2/9 Scores:     No data to display          Scribe for Treatment Team: Beather Arbour 06/03/2022 4:01 PM

## 2022-06-03 NOTE — Group Note (Signed)
Recreation Therapy Group Note   Group Topic:Health and Wellness  Group Date: 06/03/2022 Start Time: 0930 End Time: 0955 Facilitators: Creedence Kunesh-McCall, LRT,CTRS Location: [address] Dayroom   Goal Area(s) Addresses:  Patient will define components of whole wellness. Patient will verbalize benefit of whole wellness.   Group Description:  Exercise.  LRT and patients discussed the importance on physical exercise and its affects on the body.  LRT then explained to patients they would take turns leading the group in exercises of their choosing.  Patients were told to consider the physical abilities of peers and to anything that would be too challenging.  Patients could take breaks and get water as needed.     Affect/Mood: N/A   Participation Level: Did not attend    Clinical Observations/Individualized Feedback:      Plan: Continue to engage patient in RT group sessions 2-3x/week.   Admir Candelas-McCall, LRT,CTRS 06/03/2022 1:37 PM

## 2022-06-03 NOTE — [facility] Suicide Risk Assessment (Signed)
Suicide Risk Assessment  Admission Assessment    [facility] Admission Suicide Risk Assessment   Nursing information obtained from:  Patient Demographic factors:  Gay, lesbian, or bisexual orientation, Adolescent or young adult Current Mental Status:  Suicidal ideation indicated by patient, Self-harm thoughts, Plan includes specific time, place, or method, Suicidal ideation indicated by others, Self-harm behaviors, Suicide plan, Intention to act on suicide plan Loss Factors:  Loss of significant relationship Historical Factors:  NA Risk Reduction Factors:  Employed, Living with another person, especially a relative, Positive therapeutic relationship  Total Time spent with patient: 1.5 hours Principal Problem: Bipolar 2 disorder, major depressive episode Diagnosis:  Principal Problem:   Bipolar 2 disorder, major depressive episode Active Problems:   GAD (generalized anxiety disorder)   Insomnia   Difficulty controlling anger  Subjective Data: CC: "I tried to kill myself two nights ago. I got a portable gas stove and I turned it on in my car."   Continued Clinical Symptoms: Depressive symptoms & +HI towards ex BF in need of continuous hospitalization for treatment and stabilization of mood.  Alcohol Use Disorder Identification Test Final Score (AUDIT): 0 The "Alcohol Use Disorders Identification Test", Guidelines for Use in Primary Care, Second Edition.  World Science writer [facility]). Score between 0-7:  no or low risk or alcohol related problems. Score between 8-15:  moderate risk of alcohol related problems. Score between 16-19:  high risk of alcohol related problems. Score 20 or above:  warrants further diagnostic evaluation for alcohol dependence and treatment.  CLINICAL FACTORS:   Depression:   Aggression Anhedonia Hopelessness Impulsivity Insomnia Severe  Musculoskeletal: Strength & Muscle Tone: within normal limits Gait & Station: normal Patient leans: N/A  Psychiatric  Specialty Exam:  Presentation  General Appearance:  Appropriate for Environment; Fairly Groomed  Eye Contact: Good  Speech: Clear and Coherent  Speech Volume: Normal  Handedness: Right   Mood and Affect  Mood: Anxious; Depressed  Affect: Congruent   Thought Process  Thought Processes: Coherent  Descriptions of Associations:Intact  Orientation:Full (Time, Place and Person)  Thought Content:Logical  History of Schizophrenia/Schizoaffective disorder:No data recorded Duration of Psychotic Symptoms:No data recorded Hallucinations:Hallucinations: None  Ideas of Reference:None  Suicidal Thoughts:Suicidal Thoughts: No SI Active Intent and/or Plan: Without Plan  Homicidal Thoughts:Homicidal Thoughts: No HI Passive Intent and/or Plan: Without Plan   Sensorium  Memory: Immediate Good  Judgment: Poor  Insight: Poor   Executive Functions  Concentration: Good  Attention Span: Good  Recall: Good  Fund of Knowledge: Good  Language: Good   Psychomotor Activity  Psychomotor Activity: Psychomotor Activity: Normal   Assets  Assets: Communication Skills   Sleep  Sleep: Sleep: Poor    Physical Exam: Physical Exam Review of Systems  Psychiatric/Behavioral:  Positive for depression and substance abuse. Negative for hallucinations, memory loss and suicidal ideas. The patient is nervous/anxious and has insomnia.    Blood pressure 104/66, pulse 88, temperature 98.9 F (37.2 C), temperature source Oral, resp. rate 18, height  (1.727 m), weight 90.8 kg, SpO2 100 %. Body mass index is 30.44 kg/m.   COGNITIVE FEATURES THAT CONTRIBUTE TO RISK:  None    SUICIDE RISK:   Mild:  Suicidal ideation of limited frequency, intensity, duration, and specificity.  There are no identifiable plans, no associated intent, mild dysphoria and related symptoms, good self-control (both objective and subjective assessment), few other risk factors, and  identifiable protective factors, including available and accessible social support.  PLAN OF CARE: See H & P  I certify that inpatient services furnished can reasonably be expected to improve the patient's condition.   Starleen Blue, NP 06/03/2022, 7:04 PM

## 2022-06-03 NOTE — Group Note (Signed)
Date:  06/03/2022 Time:  9:54 AM  Group Topic/Focus:  Goals Group:   The focus of this group is to help patients establish daily goals to achieve during treatment and discuss how the patient can incorporate goal setting into their daily lives to aide in recovery. Orientation:   The focus of this group is to educate the patient on the purpose and policies of crisis stabilization and provide a format to answer questions about their admission.  The group details unit policies and expectations of patients while admitted.    Participation Level:  Did Not Attend   Additional Comments:  Patient was informed multiple times that group was starting but did not attend.   Trace Cederberg T Lorraine Lax 06/03/2022, 9:54 AM

## 2022-06-03 NOTE — BHH Group Notes (Signed)
Spiritual care group on grief and loss facilitated by Chaplain Katy Galena Logie, [facility]  Group Goal: Support / Education around grief and loss  Members engage in facilitated group support and psycho-social education.  Group Description:  Following introductions and group rules, group members engaged in facilitated group dialogue and support around topic of loss, with particular support around experiences of loss in their lives. Group Identified types of loss (relationships / self / things) and identified patterns, circumstances, and changes that precipitate losses. Reflected on thoughts / feelings around loss, normalized grief responses, and recognized variety in grief experience. Group encouraged individual reflection on safe space and on the coping skills that they are already utilizing.  Group drew on Adlerian / Rogerian and narrative framework  Patient Progress: Did not attend.  

## 2022-06-03 NOTE — Progress Notes (Signed)
   06/03/22 0600  15 Minute Checks  Location Bedroom  Visual Appearance Calm  Behavior Sleeping  Sleep (Behavioral Health Patients Only)  Calculate sleep? (Click Yes once per 24 hr at 0600 safety check) Yes  Documented sleep last 24 hours 6.5

## 2022-06-03 NOTE — Plan of Care (Signed)
  Problem: Medication: Goal: Compliance with prescribed medication regimen will improve Outcome: Progressing   Problem: Activity: Goal: Interest or engagement in activities will improve Outcome: Progressing   Problem: Coping: Goal: Ability to verbalize frustrations and anger appropriately will improve Outcome: Progressing

## 2022-06-03 NOTE — [facility] Group Notes (Signed)
BHH Group Notes:  (Nursing/MHT/Case Management/Adjunct)  Date:  06/03/2022  Time:  8:37 PM  Type of Therapy:  Group Therapy  Participation Level:  Minimal  Participation Quality:  Appropriate  Affect:  Appropriate  Cognitive:  Appropriate  Insight:  Appropriate  Engagement in Group:  Limited  Modes of Intervention:  Education  Summary of Progress/Problems: Attended wrap up group.  Evelyn Booker 06/03/2022, 8:37 PM

## 2022-06-03 NOTE — Progress Notes (Signed)
   06/03/22 2300  Psych Admission Type (Psych Patients Only)  Admission Status Voluntary  Psychosocial Assessment  Patient Complaints Depression  Eye Contact Fair  Facial Expression Sad  Affect Depressed  Speech Logical/coherent;Slow;Soft  Interaction Isolative  Motor Activity Fidgety  Appearance/Hygiene Unremarkable  Behavior Characteristics Unwilling to participate  Mood Depressed  Thought Process  Coherency WDL  Content WDL  Delusions None reported or observed  Perception WDL  Hallucination None reported or observed  Judgment Limited  Confusion WDL  Danger to Self  Current suicidal ideation? Passive  Agreement Not to Harm Self Yes  Description of Agreement verbal  Danger to Others  Danger to Others None reported or observed

## 2022-06-04 LAB — LIPID PANEL
Cholesterol: 174 mg/dL (ref 0–200)
HDL: 53 mg/dL (ref >40)
LDL Cholesterol: 104 mg/dL — ABNORMAL HIGH (ref 0–99)
Total CHOL/HDL Ratio: 3.3 RATIO
Triglycerides: 83 mg/dL (ref <150)
VLDL: 17 mg/dL (ref 0–40)

## 2022-06-04 LAB — VITAMIN D 25 HYDROXY (VIT D DEFICIENCY, FRACTURES): Vit D, 25-Hydroxy: 14.69 ng/mL — ABNORMAL LOW (ref 30–100)

## 2022-06-04 LAB — BASIC METABOLIC PANEL
Anion gap: 8 (ref 5–15)
BUN: 10 mg/dL (ref 6–20)
CO2: 24 mmol/L (ref 22–32)
Calcium: 9.1 mg/dL (ref 8.9–10.3)
Chloride: 105 mmol/L (ref 98–111)
Creatinine, Ser: 0.78 mg/dL (ref 0.44–1.00)
GFR, Estimated: >60 mL/min (ref >60)
Glucose, Bld: 93 mg/dL (ref 70–99)
Potassium: 4.2 mmol/L (ref 3.5–5.1)
Sodium: 137 mmol/L (ref 135–145)

## 2022-06-04 LAB — HEMOGLOBIN A1C
Hgb A1c MFr Bld: 5.2 % (ref 4.8–5.6)
Mean Plasma Glucose: 102.54 mg/dL

## 2022-06-04 LAB — TSH: TSH: 0.538 u[IU]/mL (ref 0.350–4.500)

## 2022-06-04 NOTE — Progress Notes (Signed)
   06/04/22 0705  15 Minute Checks  Location Cafeteria  Visual Appearance Calm  Behavior Composed  Sleep (Behavioral Health Patients Only)  Calculate sleep? (Click Yes once per 24 hr at 0600 safety check) Yes  Documented sleep last 24 hours 9.75

## 2022-06-04 NOTE — Group Note (Signed)
LCSW Group Therapy Note  Group Date: 06/04/2022 Start Time: 1100 End Time: 1200   Type of Therapy and Topic:  Group Therapy - How To Cope with Nervousness about Discharge   Participation Level:  Active   Description of Group This process group involved identification of patients' feelings about discharge. Some of them are scheduled to be discharged soon, while others are new admissions, but each of them was asked to share thoughts and feelings surrounding discharge from the hospital. One common theme was that they are excited at the prospect of going home, while another was that many of them are apprehensive about sharing why they were hospitalized. Patients were given the opportunity to discuss these feelings with their peers in preparation for discharge.  Therapeutic Goals  Patient will identify their overall feelings about pending discharge. Patient will think about how they might proactively address issues that they believe will once again arise once they get home (i.e. with parents). Patients will participate in discussion about having hope for change.   Summary of Patient Progress:  Patient was very active throughout the session. Patient demonstrated positive insight into the subject matter, and proved open to input from peers and feedback from CSW. Patient was respectful of peers and participated throughout the entire session.   Therapeutic Modalities Cognitive Behavioral Therapy   Isabella Bowens, LCSWA 06/04/2022  1:00 PM

## 2022-06-04 NOTE — Progress Notes (Signed)
[facility] MD Progress Note  06/04/2022 6:49 PM [name]  MRN:  [id number]  Principal Problem: Bipolar 2 disorder, major depressive episode (HCC)  Diagnosis: Principal Problem:   Bipolar 2 disorder, major depressive episode (HCC) Active Problems:   GAD (generalized anxiety disorder)   Insomnia   Difficulty controlling anger  Reason for admission: Evelyn Booker is a 22 yo AAF with prior mental health diagnoses of GAD & MDD who presented to the [facility] [place] ER with complaints of suicidal ideations, stated that she had a plan & started executing it by taking a gas heater into her car, turning it on for a few seconds prior to calling a friend who went to her and transporter her to the ER. Pt was transported to this [facility] for treatment and stabilization of her mental status.   24-hour chart Review: Past 24 hours of patient's chart was reviewed.  Patient is compliant with scheduled meds. Required Agitation PRNs: none As needed medications: Hydroxyzine given today for anxiety and trazodone given last night for insomnia Per RN notes, no documented behavioral issues and is attending group. Patient slept, 8 hours.    Today's assessment: Patient seen and examined in the office sitting up in a chair.  Presents alert, oriented to person, place, & situation.  Reports less depressed mood with congruent affect.  Rates depression as numbered 0/10, and anxiety as #3/10.  Patient reports high anxiety comes from not talking with the ex-boyfriend.  She reports that she is angry at the boyfriend for getting a B-50 on her for 1 year and with all the restrictions.  Reports she does not trust herself if she was to leave the hospital today.  However, able to contract for safety during evaluation.  Continued on her scheduled psychotropic medication without GI discomfort.  Reported fair appetite and sleeping for over 5 hours last night.  Denies SI, HI, or AVH.  Further denies delusional thinking or paranoia.  Vital signs  reviewed with blood pressure 112/69 and pulse 107.  Nursing staff to recheck vital signs.  Lab reviewed with potassium 4.2, within normal limits.  Total Time spent with patient: 30 minutes  Past Psychiatric History: Previous Psych Diagnoses: MDD, GAD & ADHD Prior inpatient treatment: Denies  Current/prior outpatient treatment: Reports that she has one, unable to recall name of Provider, but states that she wants new provider at discharge.   Prior rehab [id number]  Psychotherapy hx: None at this time  History of suicide attempts: Denies  History of homicide or aggression: With ex BF as under HPI above Psychiatric medication history: Guanfacine  Psychiatric medication compliance history: Neuromodulation history: None  Current Psychiatrist: unable to recall Current therapist: none at this time    Past Medical History:  Past Medical History:  Diagnosis Date   ADHD    Anxiety    Depression     Past Surgical History:  Procedure Laterality Date   BREAST BIOPSY     Family History: History reviewed. No pertinent family history.  Family Psychiatric  History:  Psych:Denies  Psych YN:WGNFAO  SA/HA:Denies  Substance use family ZH:YQMVHQ    Social History:  Social History   Substance and Sexual Activity  Alcohol Use Yes   Comment: rarely     Social History   Substance and Sexual Activity  Drug Use Yes   Types: Marijuana    Social History   Socioeconomic History   Marital status: Single    Spouse name: Not on file   Number of children: Not  on file   Years of education: Not on file   Highest education level: Not on file  Occupational History   Not on file  Tobacco Use   Smoking status: Never   Smokeless tobacco: Never  Vaping Use   Vaping Use: Some days   Substances: THC  Substance and Sexual Activity   Alcohol use: Yes    Comment: rarely   Drug use: Yes    Types: Marijuana   Sexual activity: Not Currently  Other Topics Concern   Not on file  Social History  Narrative   Not on file   Social Determinants of Health   Financial Resource Strain: Not on file  Food Insecurity: No Food Insecurity (06/02/2022)   Hunger Vital Sign    Worried About Running Out of Food in the Last Year: Never true    Ran Out of Food in the Last Year: Never true  Transportation Needs: No Transportation Needs (06/02/2022)   PRAPARE - Administrator, Civil Service (Medical): No    Lack of Transportation (Non-Medical): No  Physical Activity: Not on file  Stress: Not on file  Social Connections: Not on file   Additional Social History:    Sleep: Good  Appetite:  Good  Current Medications: Current Facility-Administered Medications  Medication Dose Route Frequency Provider Last Rate Last Admin   acetaminophen (TYLENOL) tablet 650 mg  650 mg Oral Q6H PRN Starleen Blue, NP   650 mg at 06/03/22 1013   alum & mag hydroxide-simeth (MAALOX/MYLANTA) 200-200-20 MG/5ML suspension 30 mL  30 mL Oral Q4H PRN Starleen Blue, NP       ARIPiprazole (ABILIFY) tablet 5 mg  5 mg Oral Daily Starleen Blue, NP   5 mg at 06/04/22 0818   diphenhydrAMINE (BENADRYL) capsule 50 mg  50 mg Oral TID PRN Starleen Blue, NP       Or   diphenhydrAMINE (BENADRYL) injection 50 mg  50 mg Intramuscular TID PRN Starleen Blue, NP       FLUoxetine (PROZAC) capsule 10 mg  10 mg Oral Daily Starleen Blue, NP   10 mg at 06/04/22 0818   haloperidol (HALDOL) tablet 5 mg  5 mg Oral TID PRN Starleen Blue, NP       Or   haloperidol lactate (HALDOL) injection 5 mg  5 mg Intramuscular TID PRN Starleen Blue, NP       hydrOXYzine (ATARAX) tablet 25 mg  25 mg Oral TID PRN Starleen Blue, NP   25 mg at 06/04/22 1814   lamoTRIgine (LAMICTAL) tablet 25 mg  25 mg Oral Daily Starleen Blue, NP   25 mg at 06/04/22 0818   LORazepam (ATIVAN) tablet 2 mg  2 mg Oral TID PRN Starleen Blue, NP       Or   LORazepam (ATIVAN) injection 2 mg  2 mg Intramuscular TID PRN Starleen Blue, NP       magnesium hydroxide  (MILK OF MAGNESIA) suspension 30 mL  30 mL Oral Daily PRN Nkwenti, Doris, NP       traZODone (DESYREL) tablet 50 mg  50 mg Oral QHS PRN Starleen Blue, NP   50 mg at 06/03/22 2218    Lab Results:  Results for orders placed or performed during the hospital encounter of 06/02/22 (from the past 48 hour(s))  Basic metabolic panel     Status: None   Collection Time: 06/04/22  6:29 AM  Result Value Ref Range   Sodium 137 135 - 145 mmol/L  Potassium 4.2 3.5 - 5.1 mmol/L   Chloride 105 98 - 111 mmol/L   CO2 24 22 - 32 mmol/L   Glucose, Bld 93 70 - 99 mg/dL    Comment: Glucose reference range applies only to samples taken after fasting for at least 8 hours.   BUN 10 6 - 20 mg/dL   Creatinine, Ser 1.61 0.44 - 1.00 mg/dL   Calcium 9.1 8.9 - 09.6 mg/dL   GFR, Estimated >04 >54 mL/min    Comment: (NOTE) Calculated using the CKD-EPI Creatinine Equation (2021)    Anion gap 8 5 - 15    Comment: Performed at [facility], [address] [place], [place] [address]  TSH     Status: None   Collection Time: 06/04/22  6:29 AM  Result Value Ref Range   TSH 0.538 0.350 - 4.500 uIU/mL    Comment: Performed by a 3rd Generation assay with a functional sensitivity of <=0.01 uIU/mL. Performed at [facility], [address], [place], [place] [address]   Hemoglobin A1c     Status: None   Collection Time: 06/04/22  6:29 AM  Result Value Ref Range   Hgb A1c MFr Bld 5.2 4.8 - 5.6 %    Comment: (NOTE) Pre diabetes:          5.7%-6.4%  Diabetes:              >6.4%  Glycemic control for   <7.0% adults with diabetes    Mean Plasma Glucose 102.54 mg/dL    Comment: Performed at [facility], [address], [place], [place] [address]  Lipid panel     Status: Abnormal   Collection Time: 06/04/22  6:29 AM  Result Value Ref Range   Cholesterol 174 0 - 200 mg/dL   Triglycerides 83 <130 mg/dL   HDL 53 >86 mg/dL   Total CHOL/HDL Ratio 3.3 RATIO   VLDL 17 0 - 40  mg/dL   LDL Cholesterol 578 (H) 0 - 99 mg/dL    Comment:        Total Cholesterol/HDL:CHD Risk Coronary Heart Disease Risk Table                     Men   Women  1/2 Average Risk   3.4   3.3  Average Risk       5.0   4.4  2 X Average Risk   9.6   7.1  3 X Average Risk  23.4   11.0        Use the calculated Patient Ratio above and the CHD Risk Table to determine the patient's CHD Risk.        ATP III CLASSIFICATION (LDL):  <100     mg/dL   Optimal  469-629  mg/dL   Near or Above                    Optimal  130-159  mg/dL   Borderline  528-413  mg/dL   High  >244     mg/dL   Very High Performed at [facility], [address], [place], [place] [address]   VITAMIN D 25 Hydroxy (Vit-D Deficiency, Fractures)     Status: Abnormal   Collection Time: 06/04/22  6:29 AM  Result Value Ref Range   Vit D, 25-Hydroxy 14.69 (L) 30 - 100 ng/mL    Comment: (NOTE) Vitamin D deficiency has been defined by the  Institute of Medicine  and an Endocrine Society practice guideline as a level of serum 25-OH  vitamin D less than 20 ng/mL (1,2). The Endocrine Society went on to  further define vitamin D insufficiency as a level between 21 and 29  ng/mL (2).  1. IOM (Institute of Medicine). 2010. Dietary reference intakes for  calcium and D. [place]: [employer]. 2. Holick MF, Binkley [place], Bischoff-Ferrari HA, et al. Evaluation,  treatment, and prevention of vitamin D deficiency: an Endocrine  Society clinical practice guideline, JCEM. 2011 Jul; 96(7): 1911-30.  Performed at [facility], [address], [place], [place] [address]     Blood Alcohol level:  Lab Results  Component Value Date   ETH <10 06/02/2022    Metabolic Disorder Labs: Lab Results  Component Value Date   HGBA1C 5.2 06/04/2022   MPG 102.54 06/04/2022   No results found for: "PROLACTIN" Lab Results  Component Value Date   CHOL 174 06/04/2022   TRIG 83 06/04/2022   HDL  53 06/04/2022   CHOLHDL 3.3 06/04/2022   VLDL 17 06/04/2022   LDLCALC 104 (H) 06/04/2022    Physical Findings: AIMS:  , ,  ,  ,    CIWA:    COWS:     Musculoskeletal: Strength & Muscle Tone: within normal limits Gait & Station: normal Patient leans: N/A  Psychiatric Specialty Exam:  Presentation  General Appearance:  Appropriate for Environment; Fairly Groomed  Eye Contact: Good  Speech: Clear and Coherent  Speech Volume: Normal  Handedness: Right  Mood and Affect  Mood: Anxious; Depressed  Affect: Congruent  Thought Process  Thought Processes: Coherent  Descriptions of Associations:Intact  Orientation:Full (Time, Place and Person)  Thought Content:Logical  History of Schizophrenia/Schizoaffective disorder:No data recorded Duration of Psychotic Symptoms:No data recorded Hallucinations:Hallucinations: None  Ideas of Reference:None  Suicidal Thoughts:Suicidal Thoughts: No  Homicidal Thoughts:Homicidal Thoughts: No  Sensorium  Memory: Immediate Good  Judgment: Poor  Insight: Poor  Executive Functions  Concentration: Good  Attention Span: Good  Recall: Good  Fund of Knowledge: Good  Language: Good  Psychomotor Activity  Psychomotor Activity: Psychomotor Activity: Normal  Assets  Assets: Communication Skills  Sleep  Sleep: Sleep: Poor  Physical Exam: Physical Exam Vitals and nursing note reviewed.  HENT:     Head: Normocephalic.     Nose: Nose normal.     Mouth/Throat:     Mouth: Mucous membranes are moist.     Pharynx: Oropharynx is clear.  Eyes:     Conjunctiva/sclera: Conjunctivae normal.     Pupils: Pupils are equal, round, and reactive to light.  Cardiovascular:     Rate and Rhythm: Tachycardia present.  Pulmonary:     Effort: Pulmonary effort is normal.  Abdominal:     Palpations: Abdomen is soft.  Genitourinary:    Comments: Deferred Musculoskeletal:        General: Normal range of motion.      Cervical back: Normal range of motion.  Skin:    General: Skin is warm.  Neurological:     Mental Status: She is alert and oriented to person, place, and time.  Psychiatric:        Behavior: Behavior normal.    Review of Systems  Constitutional: Negative.   HENT: Negative.    Eyes: Negative.   Respiratory: Negative.    Cardiovascular: Negative.   Gastrointestinal: Negative.   Genitourinary: Negative.   Musculoskeletal: Negative.   Skin: Negative.   Neurological: Negative.  Endo/Heme/Allergies: Negative.   Psychiatric/Behavioral:  Positive for depression.    Blood pressure 112/69, pulse (!) 107, temperature 97.6 F (36.4 C), temperature source Oral, resp. rate 14, height 5\' 8"  (1.727 m), weight 90.8 kg, SpO2 100 %. Body mass index is 30.44 kg/m.  Treatment Plan Summary: Daily contact with patient to assess and evaluate symptoms and progress in treatment and Medication management   Treatment Plan Summary: Daily contact with patient to assess and evaluate symptoms and progress in treatment and Medication management   Long Term Goal(s): Improvement in symptoms so as ready for discharge   Short Term Goals: Ability to identify changes in lifestyle to reduce recurrence of condition will improve, Ability to verbalize feelings will improve, Ability to disclose and discuss suicidal ideas, Ability to demonstrate self-control will improve, Ability to identify and develop effective coping behaviors will improve, Ability to maintain clinical measurements within normal limits will improve, Compliance with prescribed medications will improve, and Ability to identify triggers associated with substance abuse/mental health issues will improve   Diagnoses Principal Problem:   Bipolar 2 disorder, major depressive episode Active Problems:   GAD (generalized anxiety disorder)   Insomnia   Difficulty controlling anger   PLAN Safety and Monitoring: Voluntary admission to inpatient psychiatric  unit for safety, stabilization and treatment Daily contact with patient to assess and evaluate symptoms and progress in treatment Patient's case to be discussed in multi-disciplinary team meeting Observation Level : q15 minute checks Vital signs: q12 hours Precautions: Safety   Medications -Continue Abilify 5 mg daily for mood stabilization -Continue Prozac 10 mg daily for depressive symptoms -Continue Lamictal 25 mg daily for impulse control/anger outbursts -Continue Hydroxyzine 25 mg TID PRN for anxiety -Continue Trazodone 50 mg nightly PRN for sleep  Potassium level 4.2 within normal limits 06/04/2022  Other PRNS -Continue Tylenol 650 mg every 6 hours PRN for mild pain -Continue Maalox 30 mg every 4 hrs PRN for indigestion -Continue Milk of Magnesia as needed every 6 hrs for constipation   Discharge Planning: Social work and case management to assist with discharge planning and identification of hospital follow-up needs prior to discharge Estimated LOS: 5-7 days Discharge Concerns: Need to establish a safety plan; Medication compliance and effectiveness Discharge Goals: Return home with outpatient referrals for mental health follow-up including medication management/psychotherapy   I certify that inpatient services furnished can reasonably be expected to improve the patient's condition.      Cecilie Lowers, FNP 06/04/2022, 6:49 PM

## 2022-06-04 NOTE — BHH Group Notes (Signed)
B[facility] Group Notes:  (Nursing/MHT/Case Management/Adjunct)  Date:  06/04/2022  Time:  2000  Type of Therapy:   wrap up group  Participation Level:  Active  Participation Quality:  Appropriate, Attentive, Sharing, and Supportive  Affect:  Anxious and Depressed  Cognitive:  Alert  Insight:  Improving  Engagement in Group:  Developing/Improving  Modes of Intervention:  Clarification, Education, and Support  Summary of Progress/Problems: Positive thinking and positive change were discussed.   Johann Capers S 06/04/2022, 10:30 PM

## 2022-06-04 NOTE — Group Note (Signed)
Occupational Therapy Group Note  Group Topic:Coping Skills  Group Date: 06/04/2022 Start Time: 1430 End Time: 1500 Facilitators: Mozelle Remlinger G, OT   Group Description: Group encouraged increased engagement and participation through discussion and activity focused on "Coping Ahead." Patients were split up into teams and selected a card from a stack of positive coping strategies. Patients were instructed to act out/charade the coping skill for other peers to guess and receive points for their team. Discussion followed with a focus on identifying additional positive coping strategies and patients shared how they were going to cope ahead over the weekend while continuing hospitalization stay.  Therapeutic Goal(s): Identify positive vs negative coping strategies. Identify coping skills to be used during hospitalization vs coping skills outside of hospital/at home Increase participation in therapeutic group environment and promote engagement in treatment   Participation Level: Engaged   Participation Quality: Independent   Behavior: Appropriate   Speech/Thought Process: Relevant   Affect/Mood: Appropriate   Insight: Fair   Judgement: Fair      Modes of Intervention: Education  Patient Response to Interventions:  Attentive   Plan: Continue to engage patient in OT groups 2 - 3x/week.  06/04/2022  Roddrick Sharron G Eowyn Tabone, OT  Holbert Caples, OT  

## 2022-06-04 NOTE — Progress Notes (Signed)
   06/04/22 1203  Psych Admission Type (Psych Patients Only)  Admission Status Voluntary  Psychosocial Assessment  Patient Complaints Depression  Eye Contact Fair  Facial Expression Sad  Affect Depressed  Speech Logical/coherent;Soft;Slow  Interaction Isolative  Motor Activity Fidgety  Appearance/Hygiene Unremarkable  Behavior Characteristics Cooperative;Appropriate to situation  Mood Depressed  Thought Process  Coherency WDL  Content WDL  Delusions None reported or observed  Perception WDL  Hallucination None reported or observed  Judgment Limited  Confusion WDL  Danger to Self  Current suicidal ideation? Passive  Agreement Not to Harm Self Yes  Description of Agreement verbal  Danger to Others  Danger to Others None reported or observed

## 2022-06-04 NOTE — BHH Counselor (Signed)
Adult Comprehensive Assessment  Patient ID: Evelyn Booker, female   DOB: 22-Apr-2000, 22 y.o.   MRN: [id number]  Information Source: Information source: Patient  Current Stressors:  Patient states their primary concerns and needs for treatment are:: " Intent to kill myself " Patient states their goals for this hospitilization and ongoing recovery are:: " see what is wrong with me and learn how to cope with it " Educational / Learning stressors: " the school work is a lot and my grades are not that good " Employment / Job issues: " No" Family Relationships: " No" Financial / Lack of resources (include bankruptcy): " No " Housing / Lack of housing: " No" Physical health (include injuries & life threatening diseases): " No" Social relationships: " my ex taking out a restraining order & 50-B out on me and then I loss all my girl friends this year " Substance abuse: "No" Bereavement / Loss: " No"  Living/Environment/Situation:  Living Arrangements: Parent Living conditions (as described by patient or guardian): Pt states that it is weird because they are going through a seperation Who else lives in the home?: Parents How long has patient lived in current situation?: " couple months" What is atmosphere in current home: Comfortable  Family History:  Marital status: Single Are you sexually active?: No What is your sexual orientation?: Heterosexual Has your sexual activity been affected by drugs, alcohol, medication, or emotional stress?: pt denies Does patient have children?: No  Childhood History:  By whom was/is the patient raised?: Mother Additional childhood history information: Pt states that she does not talk to her dad at all Description of patient's relationship with caregiver when they were a child: " good was real close with my mom " Patient's description of current relationship with people who raised him/her: " we are still close " How were you disciplined when you got in  trouble as a child/adolescent?: " spankings , phone taken, and no tv" Does patient have siblings?: Yes Number of Siblings: 4 Description of patient's current relationship with siblings: Pt has 3 half siblings and 1 step sibling that she does not talk to Did patient suffer any verbal/emotional/physical/sexual abuse as a child?: No Did patient suffer from severe childhood neglect?: No Has patient ever been sexually abused/assaulted/raped as an adolescent or adult?: No Was the patient ever a victim of a crime or a disaster?: No Witnessed domestic violence?: No Has patient been affected by domestic violence as an adult?: No  Education:  Highest grade of school patient has completed: HS Currently a student?: Yes Name of school: [employer] How long has the patient attended?: 1 year Learning disability?: No  Employment/Work Situation:   Employment Situation: Employed Where is Patient Currently Employed?: [employer] How Long has Patient Been Employed?: pt states that she has been there for a week 1/2 Are You Satisfied With Your Job?: Yes Do You Work More Than One Job?: No Work Stressors: none Patient's Job has Been Impacted by Current Illness: No What is the Longest Time Patient has Held a Job?: 2 years Where was the Patient Employed at that Time?: [employer] Has Patient ever Been in the [employer]?: No  Financial Resources:   Surveyor, quantity resources: Income from employment, Private insurance Does patient have a representative payee or guardian?: No  Alcohol/Substance Abuse:   What has been your use of drugs/alcohol within the last 12 months?: Pt states that she does alcohol once a month and smokes marijuana daily If attempted suicide, did drugs/alcohol play a role  in this?: No Alcohol/Substance Abuse Treatment Hx: Denies past history If yes, describe treatment: N/A Has alcohol/substance abuse ever caused legal problems?: No  Social Support System:   Patient's Community Support System: Good Describe  Community Support System: Family Type of faith/religion: No How does patient's faith help to cope with current illness?: No  Leisure/Recreation:   Do You Have Hobbies?: Yes Leisure and Hobbies: Watch tv, read, and self-care  Strengths/Needs:   What is the patient's perception of their strengths?: Listening Patient states they can use these personal strengths during their treatment to contribute to their recovery: I can listen to others and their problems Patient states these barriers may affect/interfere with their treatment: No Patient states these barriers may affect their return to the community: No Other important information patient would like considered in planning for their treatment: N/A  Discharge Plan:   Currently receiving community mental health services: Yes (From Whom) (Through school but patient wants to go elsewhere) Patient states concerns and preferences for aftercare planning are: No Patient states they will know when they are safe and ready for discharge when: " Once I am able to control myself and have good coping skills" Does patient have access to transportation?: Yes Does patient have financial barriers related to discharge medications?: No Will patient be returning to same living situation after discharge?: Yes  Summary/Recommendations:   Summary and Recommendations (to be completed by the evaluator): Evelyn Booker is a 22 y/o female who was admitted to [facility] for an intent suicide attempt with a gas heater in her car. [name] states that the stress of her break up and ex taking a restaining order/ 50-B on her caused her to want to hurt herself. 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 Patient states that this is her first hospitalization with mental health diagnosis of Bipolar 2, GAD, Difficulty controlling anger, SI, and HI. Patient states that for her mental health needs for medication and therapy she goes on campus but is open to another  outside provider. Patient denies any childhood abuse. Patient does smoke marijuana daily.While here, [name]  can benefit from crisis stabilization, medication management, therapeutic milieu, and referrals for services.   Isabella Bowens. 06/04/2022

## 2022-06-04 NOTE — Progress Notes (Signed)
   06/04/22 2300  Psych Admission Type (Psych Patients Only)  Admission Status Voluntary  Psychosocial Assessment  Patient Complaints Depression  Eye Contact Fair  Facial Expression Sad;Animated  Affect Depressed  Speech Soft;Logical/coherent  Interaction Assertive  Motor Activity Slow  Appearance/Hygiene Unremarkable  Behavior Characteristics Cooperative;Appropriate to situation;Calm  Mood Depressed;Pleasant  Thought Process  Coherency WDL  Content WDL  Delusions None reported or observed  Perception WDL  Hallucination None reported or observed  Judgment Limited  Confusion None  Danger to Self  Current suicidal ideation? Denies  Agreement Not to Harm Self Yes  Description of Agreement verbal  Danger to Others  Danger to Others None reported or observed

## 2022-06-05 MED ORDER — DIVALPROEX SODIUM 250 MG PO DR TAB
250.0000 mg | DELAYED_RELEASE_TABLET | Freq: Every day | ORAL | Status: DC
  Administered 2022-06-05 – 2022-06-06 (×2): 250 mg via ORAL
  Filled 2022-06-05 (×4): qty 1

## 2022-06-05 MED ORDER — ONDANSETRON 4 MG PO TBDP
4.0000 mg | ORAL_TABLET | Freq: Three times a day (TID) | ORAL | Status: DC | PRN
  Administered 2022-06-05: 4 mg via ORAL
  Filled 2022-06-05: qty 1
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
## 2022-06-05 NOTE — [facility] Group Notes (Signed)
Adult Psychoeducational Group Note  Date:  06/05/2022 Time:  9:35 AM  Group Topic/Focus:  Goals Group:   The focus of this group is to help patients establish daily goals to achieve during treatment and discuss how the patient can incorporate goal setting into their daily lives to aide in recovery.  Participation Level:  Active  Participation Quality:  Appropriate  Affect:  Appropriate  Cognitive:  Appropriate  Insight: Appropriate  Engagement in Group:  Engaged  Modes of Intervention:  Confrontation and Education  Additional Comments:  Goal: Work on Anger  Gwinda Maine 06/05/2022, 9:35 AM
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
## 2022-06-05 NOTE — Progress Notes (Signed)
Patient presents today with "dissociation" and states "It's like I don't feel anything." On self inventory she rated depression 5/10 and anxiety 4/10. Patient states that being around people is helpful and going outside helps her feel less dissociated. Interacts pleasantly. Stated goals today are to be more compassionate and trying to stay positive. Patient reports nausea and poor sleep.   06/05/22 1200  Psych Admission Type (Psych Patients Only)  Admission Status Voluntary  Psychosocial Assessment  Patient Complaints Depression  Eye Contact Fair  Facial Expression Blank;Sad  Affect Depressed  Speech Soft;Logical/coherent  Interaction Guarded  Motor Activity Other (Comment) (WDL)  Appearance/Hygiene Unremarkable  Behavior Characteristics Cooperative;Appropriate to situation  Mood Depressed;Sad;Pleasant  Thought Process  Coherency WDL  Content WDL  Delusions None reported or observed  Perception WDL  Hallucination None reported or observed  Judgment Impaired  Confusion None  Danger to Self  Current suicidal ideation? Denies  Agreement Not to Harm Self Yes  Description of Agreement Verbal  Danger to Others  Danger to Others None reported or observed

## 2022-06-05 NOTE — [facility] Group Notes (Signed)
Adult Psychoeducational Group Note  Date:  06/05/2022 Time:  10:20 PM  Group Topic/Focus:  Wrap-Up Group:  Alcohol Anonymous Group   Participation Level:  Active  Participation Quality:  Appropriate  Affect:  Appropriate  Cognitive:  Appropriate  Insight: Appropriate  Engagement in Group:    Modes of Intervention:  Discussion  Additional Comments:  Pt attended AA group 

## 2022-06-05 NOTE — Progress Notes (Signed)
    06/05/22 0558  15 Minute Checks  Location Bedroom  Visual Appearance Calm  Behavior Sleeping  Sleep (Behavioral Health Patients Only)  Calculate sleep? (Click Yes once per 24 hr at 0600 safety check) Yes  Documented sleep last 24 hours 8.25    

## 2022-06-06 NOTE — Group Note (Signed)
Date:  06/06/2022 Time:  10:28 AM  Group Topic/Focus:  Goals Group:   The focus of this group is to help patients establish daily goals to achieve during treatment and discuss how the patient can incorporate goal setting into their daily lives to aide in recovery. Orientation:   The focus of this group is to educate the patient on the purpose and policies of crisis stabilization and provide a format to answer questions about their admission.  The group details unit policies and expectations of patients while admitted.    Participation Level:  Active  Participation Quality:  Appropriate  Affect:  Appropriate  Cognitive:  Appropriate  Insight: Appropriate  Engagement in Group:  Engaged  Modes of Intervention:  Discussion, Orientation, and Rapport Building  Additional Comments:   Pt attended and participated in the Orientation/Goals group.  Edmund Hilda Majesty Oehlert 06/06/2022, 10:28 AM

## 2022-06-06 NOTE — Progress Notes (Signed)
Adult Psychoeducational Group Note  Date:  06/06/2022 Time:  9:04 PM  Group Topic/Focus:  Wrap-Up Group:   The focus of this group is to help patients review their daily goal of treatment and discuss progress on daily workbooks.  Participation Level:  Active  Participation Quality:  Appropriate  Affect:  Appropriate  Cognitive:  Alert and Appropriate  Insight: Appropriate  Engagement in Group:  Engaged  Modes of Intervention:  Discussion  Additional Comments:    Chauncey Fischer 06/06/2022, 9:04 PM

## 2022-06-06 NOTE — Group Note (Signed)
Date:  06/06/2022 Time:  6:26 PM  Group Topic/Focus:  Social Wellness (Self and Interpersonal)    Participation Level:  Active  Participation Quality:  Appropriate  Affect:  Appropriate  Cognitive:  Appropriate  Insight: Appropriate  Engagement in Group:  Engaged  Modes of Intervention:  Activity  Additional Comments:   Pt attended and participated in the Social Wellness group.  Evelyn Booker 06/06/2022, 6:26 PM
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
## 2022-06-06 NOTE — Group Note (Signed)
Date:  06/06/2022 Time:  5:06 PM  Group Topic/Focus:  Intellectual Wellness    Participation Level:  Active  Participation Quality:  Appropriate  Affect:  Appropriate  Cognitive:  Appropriate  Insight: Appropriate  Engagement in Group:  Engaged  Modes of Intervention:  Discussion and Support  Additional Comments:   Pt attended and participated in the Intellectual Wellness group.  Evelyn Booker 06/06/2022, 5:06 PM

## 2022-06-06 NOTE — Progress Notes (Signed)
[facility] MD Progress Note  06/06/2022 9:46 AM Evelyn Booker  MRN:  [id number]  Principal Problem: Bipolar 2 disorder, major depressive episode (HCC)  Diagnosis: Principal Problem:   Bipolar 2 disorder, major depressive episode (HCC) Active Problems:   GAD (generalized anxiety disorder)   Insomnia   Difficulty controlling anger  Reason for admission: Evelyn Booker is a 22 yo AAF with prior mental health diagnoses of GAD & MDD who presented to the [facility] ER with complaints of suicidal ideations, stated that she had a plan & started executing it by taking a gas heater into her car, turning it on for a few seconds prior to calling a friend who went to her and transporter her to the ER. Pt was transported to this [facility] for treatment and stabilization of her mental status.   24-hour chart Review: Staff report patient was more withdrawn in her room during evening shift, more visible this morning. Pleasant on approach. Attended some groups; documented attending AA group during the night, morning group today. Medication compliant. PRN Hydroxyzine 25 mg used for anxiety symptoms, Trazodone for sleep. She did complain of nausea/vomiting that she believe may be related to medications. PRN Zofran ODT started; received x1 1819. Slept overnight without any issues. No behavioral issues noted by staff.     Today's assessment: Patient observed in dayroom watching television, appears to be interacting appropriately with peers and staff. Assessment completed in her room where she presents casually dressed. Alert and oriented to person, place, time & situation. Mood is significantly improved from day prior as she is more engaged; affect congruent. She reports speaking with her mother daily, identifies her as an active support. Endorses attending groups that she has found to be helpful; has journal she is carrying around writing down notes and coping skills to implement/work on. States current goal she is working on keeping  her personal spaces clean. She inquired about bipolar symptoms and endorses recently keeping track of her mood via an app where she noticed times of feeling 'overstimulated'. Feels outpatient therapy is going to be a good reinforcement in helping her keep track. Current plan is to live with mom upon discharge and to remain in school. She is a Printmaker at [employer] where she reports having good grades until recently due to the situation with her boyfriend. She discusses that relationship today and acknowledged 'being emotionally dependent on him' and is 'trying to remember who I was before him'. She continues to endorse poor appetite but has the desire to eat, stating she continues to feel some nausea but has not vomited since yesterday. Provider discussed the medication changes made yesterday and team will monitor today for continued improvement. Zofran last used yesterday evening. She rates depression and anxiety 3/10 with 10 being the highest severity.  Remains medication compliant. She admits having thoughts of wanting to strangle boyfriend last night but denies having any thoughts today and is 'working on focusing on myself'. She further denies any SI/AVH. No obvious signs of delusional thinking or paranoia noted. Contracts for safety.   Total Time spent with patient: 30 minutes  Past Psychiatric History: Previous Psych Diagnoses: MDD, GAD & ADHD Prior inpatient treatment: Denies  Current/prior outpatient treatment: Reports that she has one, unable to recall name of Provider, but states that she wants new provider at discharge.   Prior rehab WU:[id number]  Psychotherapy hx: None at this time  History of suicide attempts: Denies  History of homicide or aggression: With ex BF as under  HPI above Psychiatric medication history: Guanfacine  Psychiatric medication compliance history: Neuromodulation history: None  Current Psychiatrist: unable to recall Current therapist: none at this time    Past Medical  History:  Past Medical History:  Diagnosis Date   ADHD    Anxiety    Depression     Past Surgical History:  Procedure Laterality Date   BREAST BIOPSY     Family History: History reviewed. No pertinent family history.  Family Psychiatric  History:  Psych:Denies  Psych ZO:[id number]  SA/HA:Denies  Substance use family VW:[id number]    Social History:  Social History   Substance and Sexual Activity  Alcohol Use Yes   Comment: rarely     Social History   Substance and Sexual Activity  Drug Use Yes   Types: Marijuana    Social History   Socioeconomic History   Marital status: Single    Spouse name: Not on file   Number of children: Not on file   Years of education: Not on file   Highest education level: Not on file  Occupational History   Not on file  Tobacco Use   Smoking status: Never   Smokeless tobacco: Never  Vaping Use   Vaping Use: Some days   Substances: THC  Substance and Sexual Activity   Alcohol use: Yes    Comment: rarely   Drug use: Yes    Types: Marijuana   Sexual activity: Not Currently  Other Topics Concern   Not on file  Social History Narrative   Not on file   Social Determinants of Health   Financial Resource Strain: Not on file  Food Insecurity: No Food Insecurity (06/02/2022)   Hunger Vital Sign    Worried About Running Out of Food in the Last Year: Never true    Ran Out of Food in the Last Year: Never true  Transportation Needs: No Transportation Needs (06/02/2022)   PRAPARE - Administrator, Civil Service (Medical): No    Lack of Transportation (Non-Medical): No  Physical Activity: Not on file  Stress: Not on file  Social Connections: Not on file   Additional Social History:    Sleep: Good  Appetite:  Good  Current Medications: Current Facility-Administered Medications  Medication Dose Route Frequency Provider Last Rate Last Admin   acetaminophen (TYLENOL) tablet 650 mg  650 mg Oral Q6H PRN Starleen Blue, NP   650  mg at 06/03/22 1013   alum & mag hydroxide-simeth (MAALOX/MYLANTA) 200-200-20 MG/5ML suspension 30 mL  30 mL Oral Q4H PRN Starleen Blue, NP       ARIPiprazole (ABILIFY) tablet 5 mg  5 mg Oral Daily Nkwenti, Doris, NP   5 mg at 06/06/22 0815   diphenhydrAMINE (BENADRYL) capsule 50 mg  50 mg Oral TID PRN Starleen Blue, NP       Or   diphenhydrAMINE (BENADRYL) injection 50 mg  50 mg Intramuscular TID PRN Starleen Blue, NP       divalproex (DEPAKOTE) DR tablet 250 mg  250 mg Oral QHS Leevy-Johnson, Fitzgerald Dunne A, NP   250 mg at 06/05/22 2129   FLUoxetine (PROZAC) capsule 10 mg  10 mg Oral Daily Nkwenti, Tyler Aas, NP   10 mg at 06/06/22 0815   haloperidol (HALDOL) tablet 5 mg  5 mg Oral TID PRN Starleen Blue, NP       Or   haloperidol lactate (HALDOL) injection 5 mg  5 mg Intramuscular TID PRN Starleen Blue, NP  hydrOXYzine (ATARAX) tablet 25 mg  25 mg Oral TID PRN Starleen Blue, NP   25 mg at 06/06/22 0816   LORazepam (ATIVAN) tablet 2 mg  2 mg Oral TID PRN Starleen Blue, NP       Or   LORazepam (ATIVAN) injection 2 mg  2 mg Intramuscular TID PRN Starleen Blue, NP       magnesium hydroxide (MILK OF MAGNESIA) suspension 30 mL  30 mL Oral Daily PRN Starleen Blue, NP       ondansetron (ZOFRAN-ODT) disintegrating tablet 4 mg  4 mg Oral Q8H PRN Leevy-Johnson, Kiaraliz Rafuse A, NP   4 mg at 06/05/22 1819   traZODone (DESYREL) tablet 50 mg  50 mg Oral QHS PRN Starleen Blue, NP   50 mg at 06/04/22 2117    Lab Results:  No results found for this or any previous visit (from the past 48 hour(s)).   Blood Alcohol level:  Lab Results  Component Value Date   ETH <10 06/02/2022    Metabolic Disorder Labs: Lab Results  Component Value Date   HGBA1C 5.2 06/04/2022   MPG 102.54 06/04/2022   No results found for: "PROLACTIN" Lab Results  Component Value Date   CHOL 174 06/04/2022   TRIG 83 06/04/2022   HDL 53 06/04/2022   CHOLHDL 3.3 06/04/2022   VLDL 17 06/04/2022   LDLCALC 104 (H) 06/04/2022     Physical Findings: AIMS:  , ,  ,  ,    CIWA:    COWS:     Musculoskeletal: Strength & Muscle Tone: within normal limits Gait & Station: normal Patient leans: N/A  Psychiatric Specialty Exam:  Presentation  General Appearance:  Appropriate for Environment  Eye Contact: Good  Speech: Clear and Coherent  Speech Volume: Normal  Handedness: Right  Mood and Affect  Mood: Euthymic  Affect: Congruent  Thought Process  Thought Processes: Coherent  Descriptions of Associations:Intact  Orientation:Full (Time, Place and Person)  Thought Content:Logical  History of Schizophrenia/Schizoaffective disorder:No data recorded Duration of Psychotic Symptoms:No data recorded Hallucinations:Hallucinations: None  Ideas of Reference:None  Suicidal Thoughts:Suicidal Thoughts: No  Homicidal Thoughts:Homicidal Thoughts: No  Sensorium  Memory: Immediate Good; Recent Good  Judgment: Fair  Insight: Shallow  Executive Functions  Concentration: Fair  Attention Span: Fair  Recall: [employer] of Knowledge: Fair  Language: Fair  Psychomotor Activity  Psychomotor Activity: Psychomotor Activity: Normal  Assets  Assets: Communication Skills  Sleep  Sleep: Sleep: Fair  Physical Exam: Physical Exam Vitals and nursing note reviewed.  HENT:     Head: Normocephalic.     Nose: Nose normal.     Mouth/Throat:     Mouth: Mucous membranes are moist.     Pharynx: Oropharynx is clear.  Eyes:     Conjunctiva/sclera: Conjunctivae normal.     Pupils: Pupils are equal, round, and reactive to light.  Cardiovascular:     Rate and Rhythm: Tachycardia present.  Pulmonary:     Effort: Pulmonary effort is normal.  Abdominal:     Palpations: Abdomen is soft.  Genitourinary:    Comments: Deferred Musculoskeletal:        General: Normal range of motion.     Cervical back: Normal range of motion.  Skin:    General: Skin is warm.  Neurological:     Mental  Status: She is alert and oriented to person, place, and time.  Psychiatric:        Behavior: Behavior normal.    Review of Systems  Constitutional: Negative.   HENT: Negative.    Eyes: Negative.   Respiratory: Negative.    Cardiovascular: Negative.   Gastrointestinal: Negative.   Genitourinary: Negative.   Musculoskeletal: Negative.   Skin: Negative.   Neurological: Negative.   Endo/Heme/Allergies: Negative.   Psychiatric/Behavioral:  Positive for depression.    Blood pressure 119/69, pulse (!) 121, temperature 98.3 F (36.8 C), temperature source Oral, resp. rate 16, height 5\' 8"  (1.727 m), weight 90.8 kg, SpO2 100 %. Body mass index is 30.44 kg/m.  Treatment Plan Summary: Daily contact with patient to assess and evaluate symptoms and progress in treatment and Medication management    Long Term Goal(s): Improvement in symptoms so as ready for discharge   Short Term Goals: Ability to identify changes in lifestyle to reduce recurrence of condition will improve, Ability to verbalize feelings will improve, Ability to disclose and discuss suicidal ideas, Ability to demonstrate self-control will improve, Ability to identify and develop effective coping behaviors will improve, Ability to maintain clinical measurements within normal limits will improve, Compliance with prescribed medications will improve, and Ability to identify triggers associated with substance abuse/mental health issues will improve   Diagnoses Principal Problem:   Bipolar 2 disorder, major depressive episode Active Problems:   GAD (generalized anxiety disorder)   Insomnia   Difficulty controlling anger   PLAN Safety and Monitoring: Voluntary admission to inpatient psychiatric unit for safety, stabilization and treatment Daily contact with patient to assess and evaluate symptoms and progress in treatment Patient's case to be discussed in multi-disciplinary team meeting Observation Level : q15 minute  checks Vital signs: q12 hours Precautions: Safety   Medications Discontinue:  Lamictal 25 mg daily for impulse control/anger outbursts; possible GI issues.   Continue:  Depakote DR 250 mg daily at night; mood stabilization; possibly titrate up day 3 Abilify 5 mg daily for mood stabilization Prozac 10 mg daily for depressive symptoms  Potassium level 4.2 within normal limits 06/04/2022  Other PRNS Tylenol 650 mg every 6 hours PRN for mild pain Maalox 30 mg every 4 hrs PRN for indigestion Milk of Magnesia as needed every 6 hrs for constipation Hydroxyzine 25 mg TID PRN for anxiety Trazodone 50 mg nightly PRN for sleep   Discharge Planning: Social work and case management to assist with discharge planning and identification of hospital follow-up needs prior to discharge Estimated LOS: 5-7 days Discharge Concerns: Need to establish a safety plan; Medication compliance and effectiveness Discharge Goals: Return home with outpatient referrals for mental health follow-up including medication management/psychotherapy   I certify that inpatient services furnished can reasonably be expected to improve the patient's condition.      Loletta Parish, NP 06/06/2022, 9:46 AM  Patient ID: Evelyn Booker, female   DOB: [birth year], 21 y.o.   MRN: [id number]

## 2022-06-06 NOTE — Progress Notes (Signed)
   06/06/22 0100  Psych Admission Type (Psych Patients Only)  Admission Status Voluntary  Psychosocial Assessment  Patient Complaints Anxiety  Eye Contact Fair  Facial Expression Animated  Affect Appropriate to circumstance  Speech Logical/coherent  Interaction Assertive  Motor Activity Slow  Appearance/Hygiene Unremarkable  Behavior Characteristics Cooperative;Appropriate to situation  Mood Pleasant  Thought Process  Coherency WDL  Content WDL  Delusions None reported or observed  Perception WDL  Hallucination None reported or observed  Judgment Poor  Confusion None  Danger to Self  Current suicidal ideation? Denies  Self-Injurious Behavior No self-injurious ideation or behavior indicators observed or expressed   Agreement Not to Harm Self Yes  Description of Agreement verbal  Danger to Others  Danger to Others None reported or observed

## 2022-06-06 NOTE — Group Note (Signed)
Date:  06/06/2022 Time:  5:21 PM  Group Topic/Focus:  Emotional Education:   The focus of this group is to discuss what feelings/emotions are, and how they are experienced.    Participation Level:  Active  Participation Quality:  Appropriate  Affect:  Appropriate  Cognitive:  Appropriate  Insight: Appropriate  Engagement in Group:  Engaged  Modes of Intervention:  Discussion and Support  Additional Comments:   Pt attended and participated in the Emotional Wellness group.  Edmund Hilda Lew Prout 06/06/2022, 5:21 PM

## 2022-06-06 NOTE — Progress Notes (Signed)
   06/06/22 2200  Psych Admission Type (Psych Patients Only)  Admission Status Voluntary  Psychosocial Assessment  Patient Complaints Anxiety  Eye Contact Fair  Facial Expression Animated  Affect Appropriate to circumstance  Speech Logical/coherent  Interaction Assertive  Motor Activity Slow  Appearance/Hygiene Unremarkable  Behavior Characteristics Cooperative;Appropriate to situation  Mood Pleasant  Thought Process  Coherency WDL  Content WDL  Delusions None reported or observed  Perception WDL  Hallucination None reported or observed  Judgment Poor  Confusion None  Danger to Self  Current suicidal ideation? Denies  Self-Injurious Behavior No self-injurious ideation or behavior indicators observed or expressed   Agreement Not to Harm Self Yes  Description of Agreement verbal  Danger to Others  Danger to Others None reported or observed  Danger to Others Abnormal  Harmful Behavior to others No threats or harm toward other people  Destructive Behavior No threats or harm toward property

## 2022-06-06 NOTE — Group Note (Deleted)
Date:  06/06/2022 Time:  6:36 PM  Group Topic/Focus:  Emotional Wellness     Participation Level:  {[facility] PARTICIPATION LEVEL:22264}  Participation Quality:  {[facility] PARTICIPATION QUALITY:22265}  Affect:  {[facility] AFFECT:22266}  Cognitive:  {[facility] COGNITIVE:22267}  Insight: {[facility] Insight2:20797}  Engagement in Group:  {[facility] ENGAGEMENT IN GROUP:22268}  Modes of Intervention:  {[facility] MODES OF INTERVENTION:22269}  Additional Comments:  ***  Evelyn Booker M Damyan Corne 06/06/2022, 6:36 PM  

## 2022-06-07 MED ORDER — DIVALPROEX SODIUM 500 MG PO DR TAB
500.0000 mg | DELAYED_RELEASE_TABLET | Freq: Every day | ORAL | Status: DC
  Administered 2022-06-08: 500 mg via ORAL
  Filled 2022-06-07 (×2): qty 1

## 2022-06-07 MED ORDER — DIVALPROEX SODIUM 250 MG PO DR TAB
250.0000 mg | DELAYED_RELEASE_TABLET | Freq: Every day | ORAL | Status: AC
  Administered 2022-06-07: 250 mg via ORAL
  Filled 2022-06-07: qty 1

## 2022-06-07 NOTE — [facility] Group Notes (Signed)
Group Topic/Focus:  Emotional Education:   The focus of this group is to discuss what feelings/emotions are, and how they are experienced.  Participation Level:  Active  Participation Quality:  Appropriate  Affect:  Appropriate  Cognitive:  Appropriate  Insight: Appropriate  Engagement in Group:  Engaged  Modes of Intervention:  Discussion  Additional Comments:  Pt participated in emotional wellness group. Pt was able to practice guided mediation to promote emotional wellness.  

## 2022-06-07 NOTE — [facility] Group Notes (Signed)
[facility] Group Notes:  (Nursing/MHT/Case Management/Adjunct)  Date:  06/07/2022  Time:  9:34 AM  Type of Therapy:   Goals group  Participation Level:  Active  Participation Quality:  Appropriate  Affect:  Appropriate  Cognitive:  Appropriate  Insight:  Appropriate  Engagement in Group:  Engaged  Modes of Intervention:  Discussion and Orientation  Summary of Progress/Problems: Goal is to complete safety plan  Evelyn Booker 06/07/2022, 9:34 AM

## 2022-06-07 NOTE — Plan of Care (Signed)
  Problem: Coping: Goal: Coping ability will improve Outcome: Progressing   Problem: Medication: Goal: Compliance with prescribed medication regimen will improve 06/07/2022 1758 by Roseanne Reno, RN Outcome: Progressing 06/07/2022 1752 by Roseanne Reno, RN Outcome: Progressing   Problem: Education: Goal: Mental status will improve Outcome: Progressing   Problem: Safety: Goal: Periods of time without injury will increase Outcome: Progressing

## 2022-06-07 NOTE — Progress Notes (Signed)
[facility] MD Progress Note  06/07/2022 5:52 PM Evelyn Booker  MRN:  [id number]  Principal Problem: Bipolar 2 disorder, major depressive episode (HCC)  Diagnosis: Principal Problem:   Bipolar 2 disorder, major depressive episode (HCC) Active Problems:   GAD (generalized anxiety disorder)   Insomnia   Difficulty controlling anger  Reason for admission: Evelyn Booker is a 22 yo AAF with prior mental health diagnoses of GAD & MDD who presented to the [facility] with complaints of suicidal ideations, stated that she had a plan & started executing it by taking a gas heater into her car, turning it on for a few seconds prior to calling a friend who went to her and transporter her to the ER. Pt was transported to this [facility] for treatment and stabilization of her mental status.   24-hour chart Review: Past 24 hours of patient's chart was reviewed.  Patient is compliant with scheduled meds. Required Agitation PRNs: none As needed medications: Maalox given yesterday at 1339 for indigestion, milk of magnesia given yesterday at 09 50 and today at 1259 for constipation, trazodone given at 2111 yesterday for insomnia, hydroxyzine given yesterday at 2019, and today at 09 18 for anxiety. Per RN notes, no documented behavioral issues and is attending group. Patient slept, 7 hours  Today's assessment: Patient was seen and examined in her room sitting up on her bed.  She appears calm and participating effectively in the examination. Assessment completed in her room where she presents casually dressed. Alert and oriented to person, place, time & situation. Mood is significantly improved from day prior as she is more engaged; affect congruent. Evelyn Booker was observed in dayroom watching television, appears to be interacting appropriately with peers and staff.  She reports speaking with her mother daily, identifies her as an active support. She inquired about bipolar and mania symptoms and endorses recently keeping track of  her mood via an app where she noticed times of feeling 'overstimulated'. Feels outpatient therapy is going to be a good reinforcement in helping her keep track. Current plan is to live with mom upon discharge and to remain in school. She is a Printmaker at [employer] where she reports having good grades until recently due to the situation with her boyfriend. She she reported good appetite today without nausea or vomiting.  Provider discussed the medication changes made yesterday and team will monitor today for continued improvement. She rates depression as #5/10 and anxiety 3/10 with 10 being the highest severity.  Remains medication compliant. She admits focusing on herself rather than her ex-boyfriend to maintain her mental health stability. She further denies any SI/HI/AVH. No obvious signs of delusional thinking or paranoia noted.  Able to contracts for safety.  Blood pressure 127/84, pulse 114.  Patient remains asymptomatic, nursing staff to recheck vital signs.  Depakote DR 250 mg p.o. q. nightly to be increased to Depakote DR  500 mg p.o. q. nightly starting tomorrow.  VPA level on 06/12/2022.  Patient case report to attending psychiatrist Dr. Enedina Finner.  Total Time spent with patient: 30 minutes  Past Psychiatric History: Previous Psych Diagnoses: MDD, GAD & ADHD Prior inpatient treatment: Denies  Current/prior outpatient treatment: Reports that she has one, unable to recall name of Provider, but states that she wants new provider at discharge.   Prior rehab XL:[id number]  Psychotherapy hx: None at this time  History of suicide attempts: Denies  History of homicide or aggression: With ex BF as under HPI above Psychiatric medication history: Guanfacine  Psychiatric medication compliance history: Neuromodulation history: None  Current Psychiatrist: unable to recall Current therapist: none at this time    Past Medical History:  Past Medical History:  Diagnosis Date   ADHD    Anxiety    Depression      Past Surgical History:  Procedure Laterality Date   BREAST BIOPSY     Family History: History reviewed. No pertinent family history.  Family Psychiatric  History:  Psych:Denies  Psych ZO:[id number]  SA/HA:Denies  Substance use family VW:[id number]    Social History:  Social History   Substance and Sexual Activity  Alcohol Use Yes   Comment: rarely     Social History   Substance and Sexual Activity  Drug Use Yes   Types: Marijuana    Social History   Socioeconomic History   Marital status: Single    Spouse name: Not on file   Number of children: Not on file   Years of education: Not on file   Highest education level: Not on file  Occupational History   Not on file  Tobacco Use   Smoking status: Never   Smokeless tobacco: Never  Vaping Use   Vaping Use: Some days   Substances: THC  Substance and Sexual Activity   Alcohol use: Yes    Comment: rarely   Drug use: Yes    Types: Marijuana   Sexual activity: Not Currently  Other Topics Concern   Not on file  Social History Narrative   Not on file   Social Determinants of Health   Financial Resource Strain: Not on file  Food Insecurity: No Food Insecurity (06/02/2022)   Hunger Vital Sign    Worried About Running Out of Food in the Last Year: Never true    Ran Out of Food in the Last Year: Never true  Transportation Needs: No Transportation Needs (06/02/2022)   PRAPARE - Administrator, Civil Service (Medical): No    Lack of Transportation (Non-Medical): No  Physical Activity: Not on file  Stress: Not on file  Social Connections: Not on file   Additional Social History:    Sleep: Good  Appetite:  Good  Current Medications: Current Facility-Administered Medications  Medication Dose Route Frequency Provider Last Rate Last Admin   acetaminophen (TYLENOL) tablet 650 mg  650 mg Oral Q6H PRN Starleen Blue, NP   650 mg at 06/03/22 1013   alum & mag hydroxide-simeth (MAALOX/MYLANTA) 200-200-20 MG/5ML  suspension 30 mL  30 mL Oral Q4H PRN Starleen Blue, NP   30 mL at 06/06/22 1339   ARIPiprazole (ABILIFY) tablet 5 mg  5 mg Oral Daily Starleen Blue, NP   5 mg at 06/07/22 0900   diphenhydrAMINE (BENADRYL) capsule 50 mg  50 mg Oral TID PRN Starleen Blue, NP       Or   diphenhydrAMINE (BENADRYL) injection 50 mg  50 mg Intramuscular TID PRN Starleen Blue, NP       divalproex (DEPAKOTE) DR tablet 250 mg  250 mg Oral QHS Leevy-Johnson, Brooke A, NP   250 mg at 06/06/22 2111   FLUoxetine (PROZAC) capsule 10 mg  10 mg Oral Daily Starleen Blue, NP   10 mg at 06/07/22 0900   haloperidol (HALDOL) tablet 5 mg  5 mg Oral TID PRN Starleen Blue, NP       Or   haloperidol lactate (HALDOL) injection 5 mg  5 mg Intramuscular TID PRN Starleen Blue, NP       hydrOXYzine (ATARAX) tablet  25 mg  25 mg Oral TID PRN Starleen Blue, NP   25 mg at 06/07/22 0918   LORazepam (ATIVAN) tablet 2 mg  2 mg Oral TID PRN Starleen Blue, NP       Or   LORazepam (ATIVAN) injection 2 mg  2 mg Intramuscular TID PRN Starleen Blue, NP       magnesium hydroxide (MILK OF MAGNESIA) suspension 30 mL  30 mL Oral Daily PRN Starleen Blue, NP   30 mL at 06/07/22 1259   ondansetron (ZOFRAN-ODT) disintegrating tablet 4 mg  4 mg Oral Q8H PRN Leevy-Johnson, Brooke A, NP   4 mg at 06/05/22 1819   traZODone (DESYREL) tablet 50 mg  50 mg Oral QHS PRN Starleen Blue, NP   50 mg at 06/06/22 2111    Lab Results:  No results found for this or any previous visit (from the past 48 hour(s)).   Blood Alcohol level:  Lab Results  Component Value Date   ETH <10 06/02/2022    Metabolic Disorder Labs: Lab Results  Component Value Date   HGBA1C 5.2 06/04/2022   MPG 102.54 06/04/2022   No results found for: "PROLACTIN" Lab Results  Component Value Date   CHOL 174 06/04/2022   TRIG 83 06/04/2022   HDL 53 06/04/2022   CHOLHDL 3.3 06/04/2022   VLDL 17 06/04/2022   LDLCALC 104 (H) 06/04/2022    Physical Findings: AIMS:  , ,  ,  ,     CIWA:    COWS:     Musculoskeletal: Strength & Muscle Tone: within normal limits Gait & Station: normal Patient leans: N/A  Psychiatric Specialty Exam:  Presentation  General Appearance:  Appropriate for Environment; Casual  Eye Contact: Good  Speech: Clear and Coherent; Normal Rate  Speech Volume: Normal  Handedness: Right  Mood and Affect  Mood: Euthymic  Affect: Congruent  Thought Process  Thought Processes: Coherent  Descriptions of Associations:Intact  Orientation:Full (Time, Place and Person)  Thought Content:Logical  History of Schizophrenia/Schizoaffective disorder:No data recorded Duration of Psychotic Symptoms:No data recorded Hallucinations:Hallucinations: None  Ideas of Reference:None  Suicidal Thoughts:Suicidal Thoughts: No SI Active Intent and/or Plan: -- (Denies)  Homicidal Thoughts:HI Passive Intent and/or Plan: -- (Denies)  Sensorium  Memory: Immediate Good; Recent Good  Judgment: Fair  Insight: Shallow  Executive Functions  Concentration: Fair  Attention Span: Good  Recall: Fair  Fund of Knowledge: Fair  Language: Good  Psychomotor Activity  Psychomotor Activity: Psychomotor Activity: Normal  Assets  Assets: Communication Skills; Physical Health; Resilience; Desire for Improvement  Sleep  Sleep: Sleep: Good Number of Hours of Sleep: 7  Physical Exam: Physical Exam Vitals and nursing note reviewed.  HENT:     Head: Normocephalic.     Nose: Nose normal.     Mouth/Throat:     Mouth: Mucous membranes are moist.     Pharynx: Oropharynx is clear.  Eyes:     Conjunctiva/sclera: Conjunctivae normal.     Pupils: Pupils are equal, round, and reactive to light.  Cardiovascular:     Rate and Rhythm: Tachycardia present.  Pulmonary:     Effort: Pulmonary effort is normal.  Abdominal:     Palpations: Abdomen is soft.  Genitourinary:    Comments: Deferred Musculoskeletal:        General: Normal  range of motion.     Cervical back: Normal range of motion.  Skin:    General: Skin is warm.  Neurological:     Mental Status: She is  alert and oriented to person, place, and time.  Psychiatric:        Behavior: Behavior normal.    Review of Systems  Constitutional: Negative.   HENT: Negative.    Eyes: Negative.   Respiratory: Negative.    Cardiovascular: Negative.   Gastrointestinal: Negative.   Genitourinary: Negative.   Musculoskeletal: Negative.   Skin: Negative.   Neurological: Negative.   Endo/Heme/Allergies: Negative.   Psychiatric/Behavioral:  Positive for depression and substance abuse. The patient is nervous/anxious.    Blood pressure 127/84, pulse (!) 114, temperature 98 F (36.7 C), temperature source Oral, resp. rate 16, height 5\' 8"  (1.727 m), weight 90.8 kg, SpO2 100 %. Body mass index is 30.44 kg/m.  Treatment Plan Summary: Daily contact with patient to assess and evaluate symptoms and progress in treatment and Medication management    Long Term Goal(s): Improvement in symptoms so as ready for discharge   Short Term Goals: Ability to identify changes in lifestyle to reduce recurrence of condition will improve, Ability to verbalize feelings will improve, Ability to disclose and discuss suicidal ideas, Ability to demonstrate self-control will improve, Ability to identify and develop effective coping behaviors will improve, Ability to maintain clinical measurements within normal limits will improve, Compliance with prescribed medications will improve, and Ability to identify triggers associated with substance abuse/mental health issues will improve   Diagnoses Principal Problem:   Bipolar 2 disorder, major depressive episode Active Problems:   GAD (generalized anxiety disorder)   Insomnia   Difficulty controlling anger   PLAN Safety and Monitoring: Voluntary admission to inpatient psychiatric unit for safety, stabilization and treatment Daily contact with  patient to assess and evaluate symptoms and progress in treatment Patient's case to be discussed in multi-disciplinary team meeting Observation Level : q15 minute checks Vital signs: q12 hours Precautions: Safety   Medications Discontinue:  Lamictal 25 mg daily for impulse control/anger outbursts; possible GI issues.   Continue:  Depakote DR 500 mg daily at night; mood stabilization starting 06/08/2022; increased from 250 mg p.o. q. nightly possibly titrate up day 3 Abilify 5 mg daily for mood stabilization Prozac 10 mg daily for depressive symptoms  Potassium level 4.2 within normal limits 06/04/2022  Other PRNS Tylenol 650 mg every 6 hours PRN for mild pain Maalox 30 mg every 4 hrs PRN for indigestion Milk of Magnesia as needed every 6 hrs for constipation Hydroxyzine 25 mg TID PRN for anxiety Trazodone 50 mg nightly PRN for sleep   Discharge Planning: Social work and case management to assist with discharge planning and identification of hospital follow-up needs prior to discharge Estimated LOS: 5-7 days Discharge Concerns: Need to establish a safety plan; Medication compliance and effectiveness Discharge Goals: Return home with outpatient referrals for mental health follow-up including medication management/psychotherapy   I certify that inpatient services furnished can reasonably be expected to improve the patient's condition.      Cecilie Lowers, FNP 06/07/2022, 5:52 PM  Patient ID: Evelyn Booker, female   DOB: 03-27-2000, 21 y.o.   MRN: [id number] Patient ID: Evelyn Booker, female   DOB: 07-Apr-2000, 21 y.o.   MRN: [id number]

## 2022-06-07 NOTE — [facility] Group Notes (Signed)
Adult Psychoeducational Group Note  Date:  06/07/2022 Time:  8:30 PM  Group Topic/Focus:  Wrap-Up Group:   The focus of this group is to help patients review their daily goal of treatment and discuss progress on daily workbooks.  Participation Level:  Active  Participation Quality:  Appropriate  Affect:  Appropriate  Cognitive:  Appropriate  Insight: Appropriate  Engagement in Group:  Engaged  Modes of Intervention:  Discussion  Additional Comments:  Pt attended wrap-up group. Pt was able to rate their day, describe something positive today and set goal for tomorrow.   

## 2022-06-07 NOTE — Progress Notes (Signed)
Patient denies SI,HI, and A/V/H with no plan or intent. Patient attending groups and socializing appropriately. Patient received hydroxyzine po prn for anxiety. Patient received milk of magnesia po prn due to reporting constipation. Patient also encouraged to drink fluids. Patient asking about discharge as she stated "I'm feeling much better." Patient remains cooperative on unit.

## 2022-06-07 NOTE — Progress Notes (Signed)
   06/07/22 2100  Psych Admission Type (Psych Patients Only)  Admission Status Voluntary  Psychosocial Assessment  Patient Complaints Anxiety  Eye Contact Fair  Facial Expression Animated  Affect Appropriate to circumstance  Speech Logical/coherent  Interaction Assertive  Motor Activity Slow  Appearance/Hygiene Unremarkable  Behavior Characteristics Cooperative;Appropriate to situation;Calm  Mood Pleasant  Thought Process  Coherency WDL  Content WDL  Delusions None reported or observed  Perception WDL  Hallucination None reported or observed  Judgment Poor  Confusion None  Danger to Self  Current suicidal ideation? Denies  Self-Injurious Behavior No self-injurious ideation or behavior indicators observed or expressed   Agreement Not to Harm Self Yes  Description of Agreement verbal  Danger to Others  Danger to Others None reported or observed  Danger to Others Abnormal  Harmful Behavior to others No threats or harm toward other people  Destructive Behavior No threats or harm toward property

## 2022-06-08 ENCOUNTER — Encounter (HOSPITAL_COMMUNITY): Payer: Self-pay

## 2022-06-08 MED ORDER — FLUOXETINE HCL 20 MG PO CAPS
20.0000 mg | ORAL_CAPSULE | Freq: Every day | ORAL | Status: DC
  Administered 2022-06-09: 20 mg via ORAL
  Filled 2022-06-08 (×2): qty 1

## 2022-06-08 MED ORDER — VITAMIN D (ERGOCALCIFEROL) 1.25 MG (50000 UNIT) PO CAPS
50000.0000 [IU] | ORAL_CAPSULE | ORAL | Status: DC
  Administered 2022-06-08: 50000 [IU] via ORAL
  Filled 2022-06-08: qty 1

## 2022-06-08 MED ORDER — PROPRANOLOL HCL 10 MG PO TABS
10.0000 mg | ORAL_TABLET | Freq: Once | ORAL | Status: AC
  Administered 2022-06-08: 10 mg via ORAL
  Filled 2022-06-08: qty 1

## 2022-06-08 MED ORDER — PROPRANOLOL HCL 10 MG PO TABS
10.0000 mg | ORAL_TABLET | Freq: Two times a day (BID) | ORAL | Status: DC
  Administered 2022-06-08 – 2022-06-09 (×2): 10 mg via ORAL
  Filled 2022-06-08 (×4): qty 1

## 2022-06-08 NOTE — Progress Notes (Signed)
[facility] MD Progress Note  06/08/2022 3:31 PM Evelyn Booker  MRN:  [id number]  Principal Problem: Bipolar 2 disorder, major depressive episode (HCC)  Diagnosis: Principal Problem:   Bipolar 2 disorder, major depressive episode (HCC) Active Problems:   GAD (generalized anxiety disorder)   Insomnia   Difficulty controlling anger  Reason for admission: Evelyn Booker is a 22 yo AAF with prior mental health diagnoses of GAD & MDD who presented to the [facility] with complaints of suicidal ideations, stated that she had a plan & started executing it by taking a gas heater into her car, turning it on for a few seconds prior to calling a friend who went to her and transporter her to the ER. Pt was transported to this [facility] for treatment and stabilization of her mental status.   24-hour chart Review: Sleep Hours last night: None documented, reports good sleep quality last night. Nursing Concerns: None reported  Behavioral episodes in the past 24 hrs: none  Medication Compliance: Compliant  Vital Signs in the past 24 hrs: Persistently elevated HR since admission  PRN Medications in the past 24 hrs: Hydroxyzine & Milk of Magnesia  Today's assessment: Pt is calm and cooperative during encounter, mood is significantly less depressed as compared to at admission. Her attention to personal hygiene and grooming is fair, eye contact is good, speech is clear & coherent. Thought contents are organized and logical, and pt currently denies SI/HI/AVH or paranoia. There is no evidence of delusional thoughts.    Pt reports that her sleep quality last night was good, denies medication related side effects, reports a good appetite, denies any current concerns. HR has been persistently elevated since admission, and was 139 earlier today morning. Orders placed for a repeat and for pt to be administered 10 mg of Inderal BID for tachycardia. Prozac increased from 10 to 20 mg daily starting tomorrow morning. Vitamin D low as  per review of labs, orders placed for Vitamin D 50.000 units weekly. Will have pt complete labs for Depakote as an outpatient.   Total Time spent with patient: 30 minutes  Past Psychiatric History: Previous Psych Diagnoses: MDD, GAD & ADHD Prior inpatient treatment: Denies  Current/prior outpatient treatment: Reports that she has one, unable to recall name of Provider, but states that she wants new provider at discharge.   Prior rehab WU:[id number]  Psychotherapy hx: None at this time  History of suicide attempts: Denies  History of homicide or aggression: With ex BF as under HPI above Psychiatric medication history: Guanfacine  Psychiatric medication compliance history: Neuromodulation history: None  Current Psychiatrist: unable to recall Current therapist: none at this time    Past Medical History:  Past Medical History:  Diagnosis Date   ADHD    Anxiety    Depression     Past Surgical History:  Procedure Laterality Date   BREAST BIOPSY     Family History: History reviewed. No pertinent family history.  Family Psychiatric  History:  Psych:Denies  Psych YN:WGNFAO  SA/HA:Denies  Substance use family ZH:[id number]    Social History:  Social History   Substance and Sexual Activity  Alcohol Use Yes   Comment: rarely     Social History   Substance and Sexual Activity  Drug Use Yes   Types: Marijuana    Social History   Socioeconomic History   Marital status: Single    Spouse name: Not on file   Number of children: Not on file   Years of education:  Not on file   Highest education level: Not on file  Occupational History   Not on file  Tobacco Use   Smoking status: Never   Smokeless tobacco: Never  Vaping Use   Vaping Use: Some days   Substances: THC  Substance and Sexual Activity   Alcohol use: Yes    Comment: rarely   Drug use: Yes    Types: Marijuana   Sexual activity: Not Currently  Other Topics Concern   Not on file  Social History Narrative   Not on  file   Social Determinants of Health   Financial Resource Strain: Not on file  Food Insecurity: No Food Insecurity (06/02/2022)   Hunger Vital Sign    Worried About Running Out of Food in the Last Year: Never true    Ran Out of Food in the Last Year: Never true  Transportation Needs: No Transportation Needs (06/02/2022)   PRAPARE - Administrator, Civil Service (Medical): No    Lack of Transportation (Non-Medical): No  Physical Activity: Not on file  Stress: Not on file  Social Connections: Not on file   Additional Social History:    Sleep: Good  Appetite:  Good  Current Medications: Current Facility-Administered Medications  Medication Dose Route Frequency Provider Last Rate Last Admin   acetaminophen (TYLENOL) tablet 650 mg  650 mg Oral Q6H PRN Starleen Blue, NP   650 mg at 06/03/22 1013   alum & mag hydroxide-simeth (MAALOX/MYLANTA) 200-200-20 MG/5ML suspension 30 mL  30 mL Oral Q4H PRN Starleen Blue, NP   30 mL at 06/06/22 1339   ARIPiprazole (ABILIFY) tablet 5 mg  5 mg Oral Daily Starleen Blue, NP   5 mg at 06/08/22 1610   diphenhydrAMINE (BENADRYL) capsule 50 mg  50 mg Oral TID PRN Starleen Blue, NP       Or   diphenhydrAMINE (BENADRYL) injection 50 mg  50 mg Intramuscular TID PRN Starleen Blue, NP       divalproex (DEPAKOTE) DR tablet 500 mg  500 mg Oral QHS Ntuen, Jesusita Oka, FNP       [START ON 06/09/2022] FLUoxetine (PROZAC) capsule 20 mg  20 mg Oral Daily Lahari Suttles, NP       haloperidol (HALDOL) tablet 5 mg  5 mg Oral TID PRN Starleen Blue, NP       Or   haloperidol lactate (HALDOL) injection 5 mg  5 mg Intramuscular TID PRN Starleen Blue, NP       hydrOXYzine (ATARAX) tablet 25 mg  25 mg Oral TID PRN Starleen Blue, NP   25 mg at 06/07/22 2058   LORazepam (ATIVAN) tablet 2 mg  2 mg Oral TID PRN Starleen Blue, NP       Or   LORazepam (ATIVAN) injection 2 mg  2 mg Intramuscular TID PRN Starleen Blue, NP       magnesium hydroxide (MILK OF MAGNESIA)  suspension 30 mL  30 mL Oral Daily PRN Starleen Blue, NP   30 mL at 06/07/22 1259   ondansetron (ZOFRAN-ODT) disintegrating tablet 4 mg  4 mg Oral Q8H PRN Leevy-Johnson, Brooke A, NP   4 mg at 06/05/22 1819   propranolol (INDERAL) tablet 10 mg  10 mg Oral BID Starleen Blue, NP       propranolol (INDERAL) tablet 10 mg  10 mg Oral Once Starleen Blue, NP       traZODone (DESYREL) tablet 50 mg  50 mg Oral QHS PRN Starleen Blue, NP  50 mg at 06/06/22 2111   Vitamin D (Ergocalciferol) (DRISDOL) 1.25 MG (50000 UNIT) capsule 50,000 Units  50,000 Units Oral Q7 days Starleen Blue, NP       Lab Results:  No results found for this or any previous visit (from the past 48 hour(s)).  Blood Alcohol level:  Lab Results  Component Value Date   ETH <10 06/02/2022   Metabolic Disorder Labs: Lab Results  Component Value Date   HGBA1C 5.2 06/04/2022   MPG 102.54 06/04/2022   No results found for: "PROLACTIN" Lab Results  Component Value Date   CHOL 174 06/04/2022   TRIG 83 06/04/2022   HDL 53 06/04/2022   CHOLHDL 3.3 06/04/2022   VLDL 17 06/04/2022   LDLCALC 104 (H) 06/04/2022    Physical Findings: AIMS:  0 , CIWA:   n/a  COWS:   n/a  Musculoskeletal: Strength & Muscle Tone: within normal limits Gait & Station: normal Patient leans: N/A  Psychiatric Specialty Exam:  Presentation  General Appearance:  Appropriate for Environment; Fairly Groomed  Eye Contact: Good  Speech: Clear and Coherent  Speech Volume: Normal  Handedness: Right  Mood and Affect  Mood: Depressed; Anxious  Affect: Congruent  Thought Process  Thought Processes: Coherent  Descriptions of Associations:Intact  Orientation:Full (Time, Place and Person)  Thought Content:Logical  History of Schizophrenia/Schizoaffective disorder:No data recorded Duration of Psychotic Symptoms:No data recorded Hallucinations:Hallucinations: None  Ideas of Reference:None  Suicidal Thoughts:Suicidal Thoughts:  No SI Active Intent and/or Plan: -- (Denies)  Homicidal Thoughts:Homicidal Thoughts: No HI Passive Intent and/or Plan: -- (Denies)  Sensorium  Memory: Immediate Good  Judgment: Fair  Insight: Fair  Art therapist  Concentration: Fair  Attention Span: Fair  Recall: Fair  Fund of Knowledge: Fair  Language: Fair  Psychomotor Activity  Psychomotor Activity: Psychomotor Activity: Normal  Assets  Assets: Communication Skills  Sleep  Sleep: Sleep: Good Number of Hours of Sleep: 7  Physical Exam: Physical Exam Vitals and nursing note reviewed.  HENT:     Head: Normocephalic.     Nose: Nose normal.     Mouth/Throat:     Mouth: Mucous membranes are moist.     Pharynx: Oropharynx is clear.  Eyes:     Conjunctiva/sclera: Conjunctivae normal.     Pupils: Pupils are equal, round, and reactive to light.  Cardiovascular:     Rate and Rhythm: Tachycardia present.  Pulmonary:     Effort: Pulmonary effort is normal.  Abdominal:     Palpations: Abdomen is soft.  Genitourinary:    Comments: Deferred Musculoskeletal:        General: Normal range of motion.     Cervical back: Normal range of motion.  Skin:    General: Skin is warm.  Neurological:     Mental Status: She is alert and oriented to person, place, and time.  Psychiatric:        Behavior: Behavior normal.    Review of Systems  Constitutional: Negative.   HENT: Negative.    Eyes: Negative.   Respiratory: Negative.    Cardiovascular: Negative.   Gastrointestinal: Negative.   Genitourinary: Negative.   Musculoskeletal: Negative.   Skin: Negative.   Neurological: Negative.  Negative for dizziness.  Endo/Heme/Allergies: Negative.   Psychiatric/Behavioral:  Positive for depression and substance abuse. Negative for hallucinations, memory loss and suicidal ideas. The patient is nervous/anxious and has insomnia.    Blood pressure 112/77, pulse (!) 139, temperature 98.4 F (36.9 C), temperature  source Oral, resp. rate  16, height 5\' 8"  (1.727 m), weight 90.8 kg, SpO2 97 %. Body mass index is 30.44 kg/m.  Treatment Plan Summary: Daily contact with patient to assess and evaluate symptoms and progress in treatment and Medication management    Long Term Goal(s): Improvement in symptoms so as ready for discharge   Short Term Goals: Ability to identify changes in lifestyle to reduce recurrence of condition will improve, Ability to verbalize feelings will improve, Ability to disclose and discuss suicidal ideas, Ability to demonstrate self-control will improve, Ability to identify and develop effective coping behaviors will improve, Ability to maintain clinical measurements within normal limits will improve, Compliance with prescribed medications will improve, and Ability to identify triggers associated with substance abuse/mental health issues will improve   Diagnoses Principal Problem:   Bipolar 2 disorder, major depressive episode Active Problems:   GAD (generalized anxiety disorder)   Insomnia   Difficulty controlling anger   PLAN Safety and Monitoring: Voluntary admission to inpatient psychiatric unit for safety, stabilization and treatment Daily contact with patient to assess and evaluate symptoms and progress in treatment Patient's case to be discussed in multi-disciplinary team meeting Observation Level : q15 minute checks Vital signs: q12 hours Precautions: Safety   Medications Discontinue:  Lamictal 25 mg daily for impulse control/anger outbursts; possible GI issues.   Continue:  -Continue Depakote DR 500 mg daily QHS for mood stabilization. Level on 4/30 prior to discharge. -Continue Abilify 5 mg daily for mood stabilization -Increase Prozac to 20 mg daily for depressive symptoms -Start Vitamin D 50.000 units weekly for Low Vitamin D level -Start Inderal 10 mg BID for tachycardia  Other PRNS Tylenol 650 mg every 6 hours PRN for mild pain Maalox 30 mg every 4 hrs  PRN for indigestion Milk of Magnesia as needed every 6 hrs for constipation Hydroxyzine 25 mg TID PRN for anxiety Trazodone 50 mg nightly PRN for sleep   Discharge Planning: Social work and case management to assist with discharge planning and identification of hospital follow-up needs prior to discharge Estimated LOS: 5-7 days Discharge Concerns: Need to establish a safety plan; Medication compliance and effectiveness Discharge Goals: Return home with outpatient referrals for mental health follow-up including medication management/psychotherapy   I certify that inpatient services furnished can reasonably be expected to improve the patient's condition.      Starleen Blue, NP 06/08/2022, 3:31 PM  Patient ID: Evelyn Booker, female   DOB: February 08, 2001, 21 y.o.   MRN: [id number] Patient ID: Evelyn Booker, female   DOB: [birth year], 21 y.o.   MRN: [id number] Patient ID: Evelyn Booker, female   DOB: [birth year], 21 y.o.   MRN: [id number]

## 2022-06-08 NOTE — Group Note (Signed)
Occupational Therapy Group Note  Group Topic:Coping Skills  Group Date: 06/08/2022 Start Time: 1430 End Time: 1500 Facilitators: Ted Mcalpine, OT   Group Description: Group encouraged increased engagement and participation through discussion and activity focused on "Coping Ahead." Patients were split up into teams and selected a card from a stack of positive coping strategies. Patients were instructed to act out/charade the coping skill for other peers to guess and receive points for their team. Discussion followed with a focus on identifying additional positive coping strategies and patients shared how they were going to cope ahead over the weekend while continuing hospitalization stay.  Therapeutic Goal(s): Identify positive vs negative coping strategies. Identify coping skills to be used during hospitalization vs coping skills outside of hospital/at home Increase participation in therapeutic group environment and promote engagement in treatment   Participation Level: Engaged   Participation Quality: Independent   Behavior: appropriate   Speech/Thought Process: Relevant   Affect/Mood: Appropriate   Insight: Fair   Judgement: Improved      Modes of Intervention: Education  Patient Response to Interventions:  Attentive   Plan: Continue to engage patient in OT groups 2 - 3x/week.  06/08/2022  Ted Mcalpine, OT  Kerrin Champagne, OT

## 2022-06-08 NOTE — BH IP Treatment Plan (Signed)
Interdisciplinary Treatment and Diagnostic Plan Update  06/08/2022 Time of Session: 8:30am, update Evelyn Booker MRN: [id number]  Principal Diagnosis: Bipolar 2 disorder, major depressive episode (HCC)  Secondary Diagnoses: Principal Problem:   Bipolar 2 disorder, major depressive episode (HCC) Active Problems:   GAD (generalized anxiety disorder)   Insomnia   Difficulty controlling anger   Current Medications:  Current Facility-Administered Medications  Medication Dose Route Frequency Provider Last Rate Last Admin   acetaminophen (TYLENOL) tablet 650 mg  650 mg Oral Q6H PRN Starleen Blue, NP   650 mg at 06/03/22 1013   alum & mag hydroxide-simeth (MAALOX/MYLANTA) 200-200-20 MG/5ML suspension 30 mL  30 mL Oral Q4H PRN Starleen Blue, NP   30 mL at 06/06/22 1339   ARIPiprazole (ABILIFY) tablet 5 mg  5 mg Oral Daily Starleen Blue, NP   5 mg at 06/08/22 4098   diphenhydrAMINE (BENADRYL) capsule 50 mg  50 mg Oral TID PRN Starleen Blue, NP       Or   diphenhydrAMINE (BENADRYL) injection 50 mg  50 mg Intramuscular TID PRN Starleen Blue, NP       divalproex (DEPAKOTE) DR tablet 500 mg  500 mg Oral QHS Ntuen, Jesusita Oka, FNP       [START ON 06/09/2022] FLUoxetine (PROZAC) capsule 20 mg  20 mg Oral Daily Nkwenti, Doris, NP       haloperidol (HALDOL) tablet 5 mg  5 mg Oral TID PRN Starleen Blue, NP       Or   haloperidol lactate (HALDOL) injection 5 mg  5 mg Intramuscular TID PRN Starleen Blue, NP       hydrOXYzine (ATARAX) tablet 25 mg  25 mg Oral TID PRN Starleen Blue, NP   25 mg at 06/07/22 2058   LORazepam (ATIVAN) tablet 2 mg  2 mg Oral TID PRN Starleen Blue, NP       Or   LORazepam (ATIVAN) injection 2 mg  2 mg Intramuscular TID PRN Starleen Blue, NP       magnesium hydroxide (MILK OF MAGNESIA) suspension 30 mL  30 mL Oral Daily PRN Starleen Blue, NP   30 mL at 06/07/22 1259   ondansetron (ZOFRAN-ODT) disintegrating tablet 4 mg  4 mg Oral Q8H PRN Leevy-Johnson, Brooke A, NP   4 mg  at 06/05/22 1819   propranolol (INDERAL) tablet 10 mg  10 mg Oral BID Starleen Blue, NP       traZODone (DESYREL) tablet 50 mg  50 mg Oral QHS PRN Starleen Blue, NP   50 mg at 06/06/22 2111   Vitamin D (Ergocalciferol) (DRISDOL) 1.25 MG (50000 UNIT) capsule 50,000 Units  50,000 Units Oral Q7 days Starleen Blue, NP   50,000 Units at 06/08/22 1623   PTA Medications: Medications Prior to Admission  Medication Sig Dispense Refill Last Dose   guanFACINE (INTUNIV) 2 MG TB24 ER tablet Take 2 mg by mouth daily.   06/01/2022   levonorgestrel-ethinyl estradiol (ALESSE) 0.1-20 MG-MCG tablet Take 1 tablet by mouth daily.   06/01/2022    Patient Stressors:    Patient Strengths:    Treatment Modalities: Medication Management, Group therapy, Case management,  1 to 1 session with clinician, Psychoeducation, Recreational therapy.   Physician Treatment Plan for Primary Diagnosis: Bipolar 2 disorder, major depressive episode (HCC) Long Term Goal(s): Improvement in symptoms so as ready for discharge   Short Term Goals: Ability to identify changes in lifestyle to reduce recurrence of condition will improve Ability to verbalize feelings will improve Ability to  disclose and discuss suicidal ideas Ability to demonstrate self-control will improve Ability to identify and develop effective coping behaviors will improve Ability to maintain clinical measurements within normal limits will improve Compliance with prescribed medications will improve Ability to identify triggers associated with substance abuse/mental health issues will improve  Medication Management: Evaluate patient's response, side effects, and tolerance of medication regimen.  Therapeutic Interventions: 1 to 1 sessions, Unit Group sessions and Medication administration.  Evaluation of Outcomes: Progressing  Physician Treatment Plan for Secondary Diagnosis: Principal Problem:   Bipolar 2 disorder, major depressive episode (HCC) Active  Problems:   GAD (generalized anxiety disorder)   Insomnia   Difficulty controlling anger  Long Term Goal(s): Improvement in symptoms so as ready for discharge   Short Term Goals: Ability to identify changes in lifestyle to reduce recurrence of condition will improve Ability to verbalize feelings will improve Ability to disclose and discuss suicidal ideas Ability to demonstrate self-control will improve Ability to identify and develop effective coping behaviors will improve Ability to maintain clinical measurements within normal limits will improve Compliance with prescribed medications will improve Ability to identify triggers associated with substance abuse/mental health issues will improve     Medication Management: Evaluate patient's response, side effects, and tolerance of medication regimen.  Therapeutic Interventions: 1 to 1 sessions, Unit Group sessions and Medication administration.  Evaluation of Outcomes: Progressing   RN Treatment Plan for Primary Diagnosis: Bipolar 2 disorder, major depressive episode (HCC) Long Term Goal(s): Knowledge of disease and therapeutic regimen to maintain health will improve  Short Term Goals: Ability to remain free from injury will improve, Ability to verbalize frustration and anger appropriately will improve, Ability to demonstrate self-control, Ability to participate in decision making will improve, Ability to verbalize feelings will improve, Ability to disclose and discuss suicidal ideas, Ability to identify and develop effective coping behaviors will improve, and Compliance with prescribed medications will improve  Medication Management: RN will administer medications as ordered by provider, will assess and evaluate patient's response and provide education to patient for prescribed medication. RN will report any adverse and/or side effects to prescribing provider.  Therapeutic Interventions: 1 on 1 counseling sessions, Psychoeducation,  Medication administration, Evaluate responses to treatment, Monitor vital signs and CBGs as ordered, Perform/monitor CIWA, COWS, AIMS and Fall Risk screenings as ordered, Perform wound care treatments as ordered.  Evaluation of Outcomes: Progressing   LCSW Treatment Plan for Primary Diagnosis: Bipolar 2 disorder, major depressive episode (HCC) Long Term Goal(s): Safe transition to appropriate next level of care at discharge, Engage patient in therapeutic group addressing interpersonal concerns.  Short Term Goals: Engage patient in aftercare planning with referrals and resources, Increase social support, Increase ability to appropriately verbalize feelings, Increase emotional regulation, Facilitate acceptance of mental health diagnosis and concerns, Facilitate patient progression through stages of change regarding substance use diagnoses and concerns, Identify triggers associated with mental health/substance abuse issues, and Increase skills for wellness and recovery  Therapeutic Interventions: Assess for all discharge needs, 1 to 1 time with Social worker, Explore available resources and support systems, Assess for adequacy in community support network, Educate family and significant other(s) on suicide prevention, Complete Psychosocial Assessment, Interpersonal group therapy.  Evaluation of Outcomes: Progressing   Progress in Treatment: Attending groups: Yes. Participating in groups: Yes. Taking medication as prescribed: Yes. Toleration medication: Yes. Family/Significant other contact made: Yes, individual(s) contacted:  [name] ( mom) [phone] Patient understands diagnosis: Yes. Discussing patient identified problems/goals with staff: No. Medical problems stabilized or  resolved: No. Denies suicidal/homicidal ideation: Yes. Issues/concerns per patient self-inventory: No.   New problem(s) identified: No, Describe:  none reported   New Short Term/Long Term  Goal(s):  medication stabilization, elimination of SI thoughts, development of comprehensive mental wellness plan.    Patient Goals:  Pt continues to work on initial tx team goals.  Discharge Plan or Barriers:  med management and therapy   Reason for Continuation of Hospitalization: Anxiety Depression Medication stabilization Suicidal ideation  Estimated Length of Stay: 1-3 days  Last 3 [place] Suicide Severity Risk Score: Flowsheet Row [facility] (Current[facility] in [facility]  C-SSRS RISK CATEGORY High Risk High Risk       Last [facility] 2/9 Scores:     No data to display          Scribe for Treatment Team: Beatris Si, LCSW 06/08/2022 4:47 PM

## 2022-06-08 NOTE — [facility] Group Notes (Signed)
Patient attended the AA group. 

## 2022-06-08 NOTE — BHH Counselor (Signed)
[facility] LCSW Progress Note   06/08/2022    2:55 PM  Evelyn Booker      Type of Note: Court Letter  CSW sent patient court letter over via email to a Alford Highland who works in the Scientist, product/process development of [employer].     Signed:   Jacob Moores, MSW, [facility] 06/08/2022 2:55 PM

## 2022-06-08 NOTE — Progress Notes (Signed)
Spiritual Resources group surrounding sources of strength facilitated by Chaplain Gayla Medicus  Group Goal: Support / Education around Scientist, product/process development  Members engage in facilitated group support and psycho-social education.  Group Description:  Following introductions and group rules, group members engaged in facilitated group dialogue and support around the topic of strength, with particular support around experiences of not having strength and/or having strength in their lives. Group Identified types of strength that motivate them and identified patterns, circumstances, and changes that included identifying their strength characteristics. Chaplain facilitated group discussion and supported group members contributions and support of one another. Group members were encouraged to individually reflect on their strengths and how they could use support systems to assist in having more strength.   Patient Progress: Evelyn Booker attended group and actively engaged and participated in the group conversation and activities.  She shared about her strengths and situations where she leans on her support system to have strength. Evelyn Booker was compassionate towards a group member and assisted him in the completion of his worksheets. She supported her peers and her comments demonstrated good insight into the topic.   Participation Level:  Active Participation Quality:  Appropriate Insight: Appropriate Engagement in Group:  Engaged Modes of Intervention:  Discussion and Worksheets  Animator

## 2022-06-08 NOTE — Progress Notes (Signed)
   06/08/22 1000  Psych Admission Type (Psych Patients Only)  Admission Status Voluntary  Psychosocial Assessment  Patient Complaints Appetite decrease  Eye Contact Fair  Facial Expression Animated  Affect Appropriate to circumstance  Speech Logical/coherent  Interaction Assertive  Motor Activity Other (Comment) (wdl)  Appearance/Hygiene Unremarkable  Behavior Characteristics Cooperative;Appropriate to situation  Mood Pleasant  Thought Process  Coherency WDL  Content WDL  Delusions None reported or observed  Perception WDL  Hallucination None reported or observed  Judgment Poor  Confusion None  Danger to Self  Current suicidal ideation? Denies  Self-Injurious Behavior No self-injurious ideation or behavior indicators observed or expressed   Agreement Not to Harm Self Yes  Description of Agreement verbal  Danger to Others  Danger to Others None reported or observed  Danger to Others Abnormal  Harmful Behavior to others No threats or harm toward other people  Destructive Behavior No threats or harm toward property

## 2022-06-08 NOTE — BHH Group Notes (Signed)
Adult Psychoeducational Group Note  Date:  06/08/2022 Time:  11:26 AM  Group Topic/Focus:  Goals Group:   The focus of this group is to help patients establish daily goals to achieve during treatment and discuss how the patient can incorporate goal setting into their daily lives to aide in recovery. Orientation:   The focus of this group is to educate the patient on the purpose and policies of crisis stabilization and provide a format to answer questions about their admission.  The group details unit policies and expectations of patients while admitted.  Participation Level:  Did Not Attend  Participation Quality:    Affect:    Cognitive:    Insight:  Engagement in Group:    Modes of Intervention:    Additional Comments:    Sheran Lawless 06/08/2022, 11:26 AM

## 2022-06-08 NOTE — Progress Notes (Signed)
   06/08/22 2200  Psych Admission Type (Psych Patients Only)  Admission Status Voluntary  Psychosocial Assessment  Patient Complaints Anxiety  Eye Contact Fair  Facial Expression Animated  Affect Appropriate to circumstance  Speech Logical/coherent  Interaction Assertive  Motor Activity Slow  Appearance/Hygiene Unremarkable  Behavior Characteristics Cooperative;Appropriate to situation  Mood Pleasant  Thought Process  Coherency WDL  Content WDL  Delusions None reported or observed  Perception WDL  Hallucination None reported or observed  Judgment Poor  Confusion None  Danger to Self  Current suicidal ideation? Denies  Self-Injurious Behavior No self-injurious ideation or behavior indicators observed or expressed   Agreement Not to Harm Self Yes  Description of Agreement verbal  Danger to Others  Danger to Others None reported or observed  Danger to Others Abnormal  Harmful Behavior to others No threats or harm toward other people  Destructive Behavior No threats or harm toward property

## 2022-06-09 LAB — VALPROIC ACID LEVEL: Valproic Acid Lvl: 66 ug/mL (ref 50.0–100.0)

## 2022-06-09 MED ORDER — DIVALPROEX SODIUM 500 MG PO DR TAB: 500.0000 mg | DELAYED_RELEASE_TABLET | Freq: Every day | ORAL | 0 refills | Status: DC

## 2022-06-09 MED ORDER — HYDROXYZINE HCL 25 MG PO TABS: 25.0000 mg | ORAL_TABLET | Freq: Three times a day (TID) | ORAL | 0 refills | Status: DC | PRN

## 2022-06-09 MED ORDER — TRAZODONE HCL 50 MG PO TABS: 50.0000 mg | ORAL_TABLET | Freq: Every evening | ORAL | 0 refills | Status: DC | PRN

## 2022-06-09 MED ORDER — PROPRANOLOL HCL 10 MG PO TABS: 10.0000 mg | ORAL_TABLET | Freq: Two times a day (BID) | ORAL | 0 refills | Status: DC

## 2022-06-09 MED ORDER — FLUOXETINE HCL 20 MG PO CAPS: 20.0000 mg | ORAL_CAPSULE | Freq: Every day | ORAL | 0 refills | Status: DC

## 2022-06-09 MED ORDER — VITAMIN D (ERGOCALCIFEROL) 1.25 MG (50000 UNIT) PO CAPS: 50000.0000 [IU] | ORAL_CAPSULE | ORAL | 0 refills | Status: DC

## 2022-06-09 MED ORDER — ARIPIPRAZOLE 5 MG PO TABS: 5.0000 mg | ORAL_TABLET | Freq: Every day | ORAL | 0 refills | Status: DC

## 2022-06-09 NOTE — Progress Notes (Signed)
  [facility] Adult Case Management Discharge Plan :  Will you be returning to the same living situation after discharge:  Yes,  Pt will be returning back to her mom house  At discharge, do you have transportation home?: Yes,  Pt mom will be picking her up around 11:15 AM/11:30 AM Do you have the ability to pay for your medications: Yes,  Pt has [facility] PPO  Release of information consent forms completed and in the chart;  Patient's signature needed at discharge.  Patient to Follow up at:  Follow-up Information     [facility] Follow up.   Specialty: Behavioral Health Why: You are scheduled for medication management on 5/8 at 9am at [facility].  New patient paperwork must be completed and returned prior to appointment. Please go by Celso Amy between 8am and 4pm ( closed 12-1 for lunch) Contact information: [address] [place] [address] [phone]        [facility] Follow up on 07/06/2022.   Why: You have a scheduled IN-PERSON therapy appointment with Lawanna Kobus on Monday 07/06/2022 @ 1:00 PM. If you have any questions or need this appointment sooner, please call the provider because other available appointments were virtual. Contact information: [address] Mervyn Skeeters [place], [place] [address] [phone]                 Next level of care provider has access to [facility] Link:yes  Safety Planning and Suicide Prevention discussed: Yes,  Martyn Malay [phone]) [phone]     Has patient been referred to the Quitline?: Patient refused referral  Patient has been referred for addiction treatment: N/A. Patient does not have an addiction.   Isabella Bowens, LCSWA 06/09/2022, 9:57 AM

## 2022-06-09 NOTE — Progress Notes (Signed)
Patient discharged to home via private car. Denied si, a/v hallucinations. Confirmed all bedside/locker items present at time of discharged. Verbalized understanding of all charge instructions.

## 2022-06-09 NOTE — BHH Suicide Risk Assessment (Signed)
Suicide Risk Assessment  Discharge Assessment    [facility] Discharge Suicide Risk Assessment   Principal Problem: Bipolar 2 disorder, major depressive episode (HCC) Discharge Diagnoses: Principal Problem:   Bipolar 2 disorder, major depressive episode (HCC) Active Problems:   GAD (generalized anxiety disorder)   Insomnia   Difficulty controlling anger  Reason for admission: Evelyn Booker is a 22 yo AAF with prior mental health diagnoses of GAD & MDD who presented to the [facility] ER with complaints of suicidal ideations, stated that she had a plan & started executing it by taking a gas heater into her car, turning it on for a few seconds prior to calling a friend who went to her and transporter her to the ER. Pt was transported to this [facility] for treatment and stabilization of her mental status.   Hospital Course: During the patient's hospitalization, patient had extensive initial psychiatric evaluation, and follow-up psychiatric evaluations every day. Psychiatric diagnoses provided upon initial assessment are as listed above. Patient's psychiatric medications were adjusted on admission as follows: -Start Abilify 5 mg daily for mood stabilization -Start Prozac 10 mg daily for depressive symptoms -Start Lamictal 25 mg daily for impulse control/anger outbursts -Start Hydroxyzine 25 mg TID PRN for anxiety -Continue Trazodone 50 mg nightly PRN for sleep -Give Potassium 40 MEQ x 1 dose, repeat BMP in the morning  During the hospitalization, other adjustments were made to the patient's psychiatric medication regimen. Medications at discharge are as follows: -Continue Depakote DR 500 mg daily QHS for mood stabilization. Level on 4/30 is therapeutic at 66. -Continue Abilify 5 mg daily for mood stabilization -Continue Prozac to 20 mg daily for depressive symptoms -Continue Vitamin D 50.000 units weekly for Low Vitamin D level -Continue Inderal 10 mg BID for tachycardia -Continue Trazodone 50 mg PRN  nightly for sleep  Patient's care was discussed during the interdisciplinary team meeting every day during the hospitalization. The patient denies having side effects to prescribed psychiatric medication. Gradually, patient started adjusting to milieu. The patient was evaluated each day by a clinical provider to ascertain response to treatment. Improvement was noted by the patient's report of decreasing symptoms, improved sleep and appetite, affect, medication tolerance, behavior, and participation in unit programming.  Patient was asked each day to complete a self inventory noting mood, mental status, pain, new symptoms, anxiety and concerns.    Symptoms were reported as significantly decreased or resolved completely by discharge. On day of discharge, the patient reports that their mood is stable. The patient denied having suicidal thoughts for more than 48 hours prior to discharge.  Patient denies having homicidal thoughts.  Patient denies having auditory hallucinations.  Patient denies any visual hallucinations or other symptoms of psychosis. The patient was motivated to continue taking medication with a goal of continued improvement in mental health. The patient reports their target psychiatric symptoms of depression, anxiety and insomnia responded well to the psychiatric medications, and the patient reports overall benefit from this psychiatric hospitalization. Supportive psychotherapy was provided to the patient. The patient also participated in regular group therapy while hospitalized. Coping skills, problem solving as well as relaxation therapies were also part of the unit programming.  Labs were reviewed with the patient, and abnormal results were discussed with the patient.  The patient is able to verbalize their individual safety plan to this provider.  # It is recommended to the patient to continue psychiatric medications as prescribed, after discharge from the hospital.    # It is  recommended  to the patient to follow up with your outpatient psychiatric provider and PCP.  # It was discussed with the patient, the impact of alcohol, drugs, tobacco have been there overall psychiatric and medical wellbeing, and total abstinence from substance use was recommended the patient.ed.  # Prescriptions provided or sent directly to preferred pharmacy at discharge. Patient agreeable to plan. Given opportunity to ask questions. Appears to feel comfortable with discharge.    # In the event of worsening symptoms, the patient is instructed to call the crisis hotline (988), 911 and or go to the nearest ED for appropriate evaluation and treatment of symptoms. To follow-up with primary care provider for other medical issues, concerns and or health care needs  # Patient was discharged home to her parents' home with a plan to follow up as noted below.  Total Time spent with patient: 45 minutes  Musculoskeletal: Strength & Muscle Tone: within normal limits Gait & Station: normal Patient leans: N/A  Psychiatric Specialty Exam  Presentation  General Appearance:  Appropriate for Environment; Well Groomed; Fairly Groomed  Eye Contact: Good  Speech: Clear and Coherent  Speech Volume: Normal  Handedness: Right   Mood and Affect  Mood: Euthymic  Duration of Depression Symptoms: No data recorded Affect: Congruent   Thought Process  Thought Processes: Coherent  Descriptions of Associations:Intact  Orientation:Full (Time, Place and Person)  Thought Content:Logical  History of Schizophrenia/Schizoaffective disorder:No data recorded Duration of Psychotic Symptoms:No data recorded Hallucinations:Hallucinations: None  Ideas of Reference:None  Suicidal Thoughts:Suicidal Thoughts: No  Homicidal Thoughts:Homicidal Thoughts: No   Sensorium  Memory: Immediate Good  Judgment: Good  Insight: Good   Executive Functions  Concentration: Good  Attention  Span: Good  Recall: Good  Fund of Knowledge: Good  Language: Good   Psychomotor Activity  Psychomotor Activity: Psychomotor Activity: Normal   Assets  Assets: Communication Skills   Sleep  Sleep: Sleep: Good   Physical Exam: Physical Exam Constitutional:      Appearance: Normal appearance.  HENT:     Head: Normocephalic.     Nose: Nose normal.  Eyes:     Pupils: Pupils are equal, round, and reactive to light.  Musculoskeletal:     Cervical back: Normal range of motion.  Neurological:     Mental Status: She is alert.    Review of Systems  Constitutional:  Negative for fever.  HENT:  Negative for hearing loss.   Eyes:  Negative for blurred vision.  Respiratory:  Negative for cough.   Cardiovascular:  Negative for chest pain.  Gastrointestinal:  Negative for heartburn.  Genitourinary:  Negative for dysuria.  Musculoskeletal:  Negative for myalgias.  Skin:  Negative for rash.  Psychiatric/Behavioral:  Positive for depression (Denies SI/HI/AVH, denies paranoia, no evidence of delusional thinking, and verbally contracts for safety outside of [facility]) and substance abuse (Educated on the negative impact of substance use on her mental health and educated on cessation). Negative for hallucinations, memory loss and suicidal ideas. The patient is nervous/anxious (resolving) and has insomnia (resolving).   Patient has specifically stated that she no longer has any intrusive thoughts of wanting to harm her boyfriend, and does not want to harm any one else. She verbally contracts for safety outside of [place], states that she will follow the court process required regarding the restraining order that was served on her by her boyfriend.  Blood pressure (!) 126/94, pulse 98, temperature 98.7 F (37.1 C), temperature source Oral, resp. rate 16, height 5\' 8"  (1.727  m), weight 90.8 kg, SpO2 100 %. Body mass index is 30.44 kg/m.  Mental Status Per Nursing Assessment::   On  Admission:  Suicidal ideation indicated by patient, Self-harm thoughts, Plan includes specific time, place, or method, Suicidal ideation indicated by others, Self-harm behaviors, Suicide plan, Intention to act on suicide plan  Demographic Factors:  Adolescent or young adult and Unemployed  Loss Factors: Loss of significant relationship  Historical Factors: Impulsivity  Risk Reduction Factors:   Living with another person, especially a relative and Positive social support  Continued Clinical Symptoms:  Patient reports that her depressive symptoms are resolving. She denies SI, denies HI, denies AVH, denies paranoia and is verbally contracting for safety outside of this [facility].  Cognitive Features That Contribute To Risk:  None    Suicide Risk:  Mild:  There are no identifiable suicide plans, no associated intent, mild dysphoria and related symptoms, good self-control (both objective and subjective assessment), few other risk factors, and identifiable protective factors, including available and accessible social support.    Follow-up Information     [facility] Follow up.   Specialty: Behavioral Health Why: You are scheduled for medication management on 5/8 at 9am at [facility].  New patient paperwork must be completed and returned prior to appointment. Please go by Celso Amy between 8am and 4pm ( closed 12-1 for lunch) Contact information: [address] [place] [address] [phone]        [facility] Follow up on 06/11/2022.   Why: You have a schediuled therapy appointment with a Dawn   on Thursday @ 2:00 PM. This first appointment will be virtual, however, you will need to call then the next buinsess day you are discharged to verbally consent and provide a card on file before appointment. They also would like for you to email your insurance card to them at [email or web address] Contact  information: [address] [place] [address] [phone]         [facility] Follow up on 07/06/2022.   Why: You have a scheduled IN-PERSON therapy appointment with Lawanna Kobus on Monday 07/06/2022 @ 1:00 PM. If you have any questions or need this appointment sooner, please call the provider because other available appointments were virtual. Contact information: [address] [place], [place] [address] [phone]                Starleen Blue, NP 06/09/2022, 9:31 AM

## 2022-06-09 NOTE — Group Note (Signed)
Recreation Therapy Group Note   Group Topic:Animal Assisted Therapy   Group Date: 06/09/2022 Start Time: 0945 End Time: 1025 Facilitators: Demitrious Mccannon, Benito Mccreedy, LRT Location: [address] Dayroom  Animal-Assisted Activity (AAA) Program Checklist/Progress Note Patient Eligibility Criteria Checklist & Daily Group note for Rec Tx Intervention   AAA/T Program Assumption of Risk Form signed by Patient/ or Parent Legal Guardian YES  Patient is free of allergies or severe asthma  YES  Patient reports no fear of animals YES  Patient reports no history of cruelty to animals YES  Patient understands their participation is voluntary YES  Patient washes hands before animal contact YES  Patient washes hands after animal contact YES   Group Description: Patients provided opportunity to interact with trained and credentialed [employer] Therapy dog and the community volunteer/dog handler. Patients practiced appropriate animal interaction and were educated on dog safety outside of the hospital in common community settings. Patients were allowed to use dog toys and other items to practice commands, engage the dog in play, and/or complete routine aspects of animal care.   Education: Charity fundraiser, Health visitor, Communication & Social Skills   Affect/Mood: Appropriate and Congruent   Participation Level: Minimal   Participation Quality: Independent   Behavior: Cooperative and Interactive    Modes of Intervention: Activity, Teaching laboratory technician, and Socialization   Patient Response to Interventions:  Initial interest   Education Outcome:  In group clarification offered    Clinical Observations/Individualized Feedback: Patient attended partial session and briefly interacted with the therapy dog and peers. Pt did not remain in dayroom consistently, exiting for phone calls and to talk with other unit staff.     Benito Mccreedy [name], LRT, CTRS 06/09/2022 12:50 PM

## 2022-06-09 NOTE — BHH Group Notes (Signed)
Adult Psychoeducational Group Note  Date:  06/09/2022 Time:  10:48 AM  Group Topic/Focus:  Goals Group:   The focus of this group is to help patients establish daily goals to achieve during treatment and discuss how the patient can incorporate goal setting into their daily lives to aide in recovery.  Participation Level:  Active  Participation Quality:  Appropriate  Affect:  Appropriate  Cognitive:  Appropriate  Insight: Appropriate  Engagement in Group:  Engaged  Modes of Intervention:  Education  Additional Comments:  Goal: Go home  Gwinda Maine 06/09/2022, 10:48 AM

## 2022-06-09 NOTE — Discharge Summary (Signed)
Physician Discharge Summary Note  Patient:  Evelyn Booker is an 22 y.o., female MRN:  [id number] DOB:  [birth year] Patient phone:  [phone] (home)  Patient address:   [address] [place] [facility] [address],  Total Time spent with patient: 45 minutes  Date of Admission:  06/02/2022 Date of Discharge: 06/09/2022  Reason for Admission:   Cuppett is a 22 yo AAF with prior mental health diagnoses of GAD & MDD who presented to the [facility] ER with complaints of suicidal ideations, stated that she had a plan & started executing it by taking a gas heater into her car, turning it on for a few seconds prior to calling a friend who went to her and transporter her to the ER. Pt was transported to this [facility] for treatment and stabilization of her mental status.   Principal Problem: Bipolar 2 disorder, major depressive episode [facility]) Discharge Diagnoses: Principal Problem:   Bipolar 2 disorder, major depressive episode (HCC) Active Problems:   GAD (generalized anxiety disorder)   Insomnia   Difficulty controlling anger  Past Psychiatric History: See H & P  Past Medical History:  Past Medical History:  Diagnosis Date   ADHD    Anxiety    Depression     Past Surgical History:  Procedure Laterality Date   BREAST BIOPSY     Family History: History reviewed. No pertinent family history. Family Psychiatric  History: See H & P Social History:  Social History   Substance and Sexual Activity  Alcohol Use Yes   Comment: rarely     Social History   Substance and Sexual Activity  Drug Use Yes   Types: Marijuana    Social History   Socioeconomic History   Marital status: Single    Spouse name: Not on file   Number of children: Not on file   Years of education: Not on file   Highest education level: Not on file  Occupational History   Not on file  Tobacco Use   Smoking status: Never   Smokeless tobacco: Never  Vaping Use   Vaping Use: Some days   Substances: THC   Substance and Sexual Activity   Alcohol use: Yes    Comment: rarely   Drug use: Yes    Types: Marijuana   Sexual activity: Not Currently  Other Topics Concern   Not on file  Social History Narrative   Not on file   Social Determinants of Health   Financial Resource Strain: Not on file  Food Insecurity: No Food Insecurity (06/02/2022)   Hunger Vital Sign    Worried About Running Out of Food in the Last Year: Never true    Ran Out of Food in the Last Year: Never true  Transportation Needs: No Transportation Needs (06/02/2022)   PRAPARE - Administrator, Civil Service (Medical): No    Lack of Transportation (Non-Medical): No  Physical Activity: Not on file  Stress: Not on file  Social Connections: Not on file   Hospital Course:   During the patient's hospitalization, patient had extensive initial psychiatric evaluation, and follow-up psychiatric evaluations every day. Psychiatric diagnoses provided upon initial assessment are as listed above. Patient's psychiatric medications were adjusted on admission as follows: -Start Abilify 5 mg daily for mood stabilization -Start Prozac 10 mg daily for depressive symptoms -Start Lamictal 25 mg daily for impulse control/anger outbursts -Start Hydroxyzine 25 mg TID PRN for anxiety -Continue Trazodone 50 mg nightly PRN for sleep -Give Potassium 40  MEQ x 1 dose, repeat BMP in the morning   During the hospitalization, other adjustments were made to the patient's psychiatric medication regimen. Medications at discharge are as follows: -Continue Depakote DR 500 mg daily QHS for mood stabilization. Level on 4/30 is therapeutic at 66. -Continue Abilify 5 mg daily for mood stabilization -Continue Prozac to 20 mg daily for depressive symptoms -Continue Vitamin D 50.000 units weekly for Low Vitamin D level -Continue Inderal 10 mg BID for tachycardia -Continue Trazodone 50 mg PRN nightly for sleep   Patient's care was discussed during the  interdisciplinary team meeting every day during the hospitalization. The patient denies having side effects to prescribed psychiatric medication. Gradually, patient started adjusting to milieu. The patient was evaluated each day by a clinical provider to ascertain response to treatment. Improvement was noted by the patient's report of decreasing symptoms, improved sleep and appetite, affect, medication tolerance, behavior, and participation in unit programming.  Patient was asked each day to complete a self inventory noting mood, mental status, pain, new symptoms, anxiety and concerns.     Symptoms were reported as significantly decreased or resolved completely by discharge. On day of discharge, the patient reports that their mood is stable. The patient denied having suicidal thoughts for more than 48 hours prior to discharge.  Patient denies having homicidal thoughts.  Patient denies having auditory hallucinations.  Patient denies any visual hallucinations or other symptoms of psychosis. The patient was motivated to continue taking medication with a goal of continued improvement in mental health. The patient reports their target psychiatric symptoms of depression, anxiety and insomnia responded well to the psychiatric medications, and the patient reports overall benefit from this psychiatric hospitalization. Supportive psychotherapy was provided to the patient. The patient also participated in regular group therapy while hospitalized. Coping skills, problem solving as well as relaxation therapies were also part of the unit programming.   Labs were reviewed with the patient, and abnormal results were discussed with the patient.   The patient is able to verbalize their individual safety plan to this provider.   # It is recommended to the patient to continue psychiatric medications as prescribed, after discharge from the hospital.     # It is recommended to the patient to follow up with your outpatient  psychiatric provider and PCP.   # It was discussed with the patient, the impact of alcohol, drugs, tobacco have been there overall psychiatric and medical wellbeing, and total abstinence from substance use was recommended the patient.ed.   # Prescriptions provided or sent directly to preferred pharmacy at discharge. Patient agreeable to plan. Given opportunity to ask questions. Appears to feel comfortable with discharge.    # In the event of worsening symptoms, the patient is instructed to call the crisis hotline (988), 911 and or go to the nearest ED for appropriate evaluation and treatment of symptoms. To follow-up with primary care provider for other medical issues, concerns and or health care needs   # Patient was discharged home to her parents' home with a plan to follow up as noted below.  Total Time spent with patient: 45 minutes   Physical Findings: AIMS: 0 CIWA:n/a COWS:  n/a  Musculoskeletal: Strength & Muscle Tone: within normal limits Gait & Station: normal Patient leans: N/A  Psychiatric Specialty Exam:  Presentation  General Appearance:  Appropriate for Environment; Well Groomed; Fairly Groomed  Eye Contact: Good  Speech: Clear and Coherent  Speech Volume: Normal  Handedness: Right   Mood and  Affect  Mood: Euthymic  Affect: Congruent   Thought Process  Thought Processes: Coherent  Descriptions of Associations:Intact  Orientation:Full (Time, Place and Person)  Thought Content:Logical  History of Schizophrenia/Schizoaffective disorder:No data recorded Duration of Psychotic Symptoms:No data recorded Hallucinations:Hallucinations: None  Ideas of Reference:None  Suicidal Thoughts:Suicidal Thoughts: No  Homicidal Thoughts:Homicidal Thoughts: No   Sensorium  Memory: Immediate Good  Judgment: Good  Insight: Good   Executive Functions  Concentration: Good  Attention Span: Good  Recall: Good  Fund of  Knowledge: Good  Language: Good   Psychomotor Activity  Psychomotor Activity: Psychomotor Activity: Normal   Assets  Assets: Communication Skills   Sleep  Sleep: Sleep: Good    Physical Exam: Physical Exam Review of Systems  Psychiatric/Behavioral:  Positive for depression (Denies SI/HI/AVH, verbally contracts for safety outside of [facility]) and substance abuse (Educated on the negative impact of subtance use on her mental health and on the need to abstain). Negative for hallucinations, memory loss and suicidal ideas. The patient is nervous/anxious (Resolving on current medications) and has insomnia (Resolving on current medications).    Blood pressure (!) 126/94, pulse 98, temperature 98.7 F (37.1 C), temperature source Oral, resp. rate 16, height 5\' 8"  (1.727 m), weight 90.8 kg, SpO2 100 %. Body mass index is 30.44 kg/m.   Social History   Tobacco Use  Smoking Status Never  Smokeless Tobacco Never   Tobacco Cessation:  N/A, patient does not currently use tobacco products   Blood Alcohol level:  Lab Results  Component Value Date   ETH <10 06/02/2022    Metabolic Disorder Labs:  Lab Results  Component Value Date   HGBA1C 5.2 06/04/2022   MPG 102.54 06/04/2022   No results found for: "PROLACTIN" Lab Results  Component Value Date   CHOL 174 06/04/2022   TRIG 83 06/04/2022   HDL 53 06/04/2022   CHOLHDL 3.3 06/04/2022   VLDL 17 06/04/2022   LDLCALC 104 (H) 06/04/2022    See Psychiatric Specialty Exam and Suicide Risk Assessment completed by Attending Physician prior to discharge.  Discharge destination:  Home  Is patient on multiple antipsychotic therapies at discharge:  No   Has Patient had three or more failed trials of antipsychotic monotherapy by history:  No  Recommended Plan for Multiple Antipsychotic Therapies: NA   Allergies as of 06/09/2022   No Known Allergies      Medication List     STOP taking these medications     guanFACINE 2 MG Tb24 ER tablet Commonly known as: INTUNIV       TAKE these medications      Indication  ARIPiprazole 5 MG tablet Commonly known as: ABILIFY Take 1 tablet (5 mg total) by mouth daily. Start taking on: Jun 10, 2022  Indication: mood stabilization   divalproex 500 MG DR tablet Commonly known as: DEPAKOTE Take 1 tablet (500 mg total) by mouth at bedtime.  Indication: Depressive Phase of Manic-Depression   FLUoxetine 20 MG capsule Commonly known as: PROZAC Take 1 capsule (20 mg total) by mouth daily. Start taking on: Jun 10, 2022  Indication: Depression   hydrOXYzine 25 MG tablet Commonly known as: ATARAX Take 1 tablet (25 mg total) by mouth 3 (three) times daily as needed for anxiety.  Indication: Feeling Anxious   levonorgestrel-ethinyl estradiol 0.1-20 MG-MCG tablet Commonly known as: ALESSE Take 1 tablet by mouth daily.  Indication: Birth Control Treatment   propranolol 10 MG tablet Commonly known as: INDERAL Take 1 tablet (10  mg total) by mouth 2 (two) times daily.  Indication: tachycardia   traZODone 50 MG tablet Commonly known as: DESYREL Take 1 tablet (50 mg total) by mouth at bedtime as needed for sleep.  Indication: Trouble Sleeping   Vitamin D (Ergocalciferol) 1.25 MG (50000 UNIT) Caps capsule Commonly known as: DRISDOL Take 1 capsule (50,000 Units total) by mouth every 7 (seven) days. Start taking on: Jun 15, 2022  Indication: Vitamin D Deficiency        Follow-up Information     [facility] Follow up.   Specialty: Behavioral Health Why: You are scheduled for medication management on 5/8 at 9am at [facility].  New patient paperwork must be completed and returned prior to appointment. Please go by Celso Amy between 8am and 4pm ( closed 12-1 for lunch) Contact information: [phone] [place] [address] [phone]        [facility] Follow up on 07/06/2022.   Why: You have a scheduled IN-PERSON therapy appointment with Lawanna Kobus on Monday 07/06/2022 @ 1:00 PM. If you have any questions or need this appointment sooner, please call the provider because other available appointments were virtual. Contact information: [address] [place], [place] [address] [phone]                Signed: Starleen Blue, NP 06/09/2022, 2:34 PM

## 2022-06-17 ENCOUNTER — Ambulatory Visit (HOSPITAL_COMMUNITY): Payer: BC Managed Care – PPO | Admitting: Psychiatry

## 2022-06-20 ENCOUNTER — Ambulatory Visit (HOSPITAL_BASED_OUTPATIENT_CLINIC_OR_DEPARTMENT_OTHER): Payer: BC Managed Care – PPO | Admitting: Psychiatry

## 2022-06-20 ENCOUNTER — Encounter (HOSPITAL_COMMUNITY): Payer: Self-pay | Admitting: Psychiatry

## 2022-06-20 DIAGNOSIS — F411 Generalized anxiety disorder: Secondary | ICD-10-CM | POA: Diagnosis not present

## 2022-06-20 DIAGNOSIS — F5102 Adjustment insomnia: Secondary | ICD-10-CM

## 2022-06-20 DIAGNOSIS — F3181 Bipolar II disorder: Secondary | ICD-10-CM | POA: Diagnosis not present

## 2022-06-20 MED ORDER — ARIPIPRAZOLE 5 MG PO TABS: 5.0000 mg | ORAL_TABLET | Freq: Every day | ORAL | 0 refills | Status: DC

## 2022-06-20 MED ORDER — DIVALPROEX SODIUM 500 MG PO DR TAB: 500.0000 mg | DELAYED_RELEASE_TABLET | Freq: Every day | ORAL | 0 refills | Status: DC

## 2022-06-20 MED ORDER — TRAZODONE HCL 50 MG PO TABS: 25.0000 mg | ORAL_TABLET | Freq: Every evening | ORAL | 0 refills | Status: DC | PRN

## 2022-06-20 MED ORDER — HYDROXYZINE HCL 25 MG PO TABS: 25.0000 mg | ORAL_TABLET | Freq: Three times a day (TID) | ORAL | 0 refills | Status: DC | PRN

## 2022-06-20 MED ORDER — FLUOXETINE HCL 20 MG PO CAPS: 20.0000 mg | ORAL_CAPSULE | Freq: Every day | ORAL | 0 refills | Status: DC

## 2022-06-20 MED ORDER — PROPRANOLOL HCL 10 MG PO TABS: 10.0000 mg | ORAL_TABLET | Freq: Every day | ORAL | 0 refills | Status: DC
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
## 2022-07-20 ENCOUNTER — Encounter (HOSPITAL_COMMUNITY): Payer: Self-pay

## 2022-07-20 ENCOUNTER — Telehealth (HOSPITAL_COMMUNITY): Payer: BC Managed Care – PPO | Admitting: Psychiatry

## 2022-10-05 ENCOUNTER — Telehealth (HOSPITAL_COMMUNITY): Payer: Self-pay | Admitting: *Deleted

## 2022-10-05 MED ORDER — PROPRANOLOL HCL 10 MG PO TABS: 10.0000 mg | ORAL_TABLET | Freq: Every day | ORAL | 0 refills | Status: DC

## 2022-10-05 MED ORDER — DIVALPROEX SODIUM 500 MG PO DR TAB: 500.0000 mg | DELAYED_RELEASE_TABLET | Freq: Every day | ORAL | 0 refills | Status: DC

## 2022-10-05 MED ORDER — TRAZODONE HCL 50 MG PO TABS: 25.0000 mg | ORAL_TABLET | Freq: Every evening | ORAL | 0 refills | Status: DC | PRN

## 2022-10-05 NOTE — Telephone Encounter (Signed)
Rx Refill Request--[facility] [address] [place], [place] - [address] [place]. AT [facility] [address]      divalproex (DEPAKOTE) 500 MG DR tablet  raZODone (DESYREL) 50 MG tablet  propranolol (INDERAL) 10 MG tablet    Next Appt  10/14/22          PREVIOUS ENTERED IN ERROR --09/13/22 Last Appt  06/20/22

## 2022-10-05 NOTE — Telephone Encounter (Signed)
Rx Refill Request--[facility] [address] [place], [place] - [address] [place]. AT [facility] [address]    divalproex (DEPAKOTE) 500 MG DR tablet  raZODone (DESYREL) 50 MG tablet  propranolol (INDERAL) 10 MG tablet   Next Appt  09/13/22 Last Appt  06/20/22

## 2022-10-05 NOTE — Addendum Note (Signed)
Addended by: Thresa Ross on: 10/05/2022 11:44 AM   Modules accepted: Orders

## 2022-10-14 ENCOUNTER — Encounter (HOSPITAL_COMMUNITY): Payer: Self-pay | Admitting: Psychiatry

## 2022-10-14 ENCOUNTER — Telehealth (INDEPENDENT_AMBULATORY_CARE_PROVIDER_SITE_OTHER): Payer: BC Managed Care – PPO | Admitting: Psychiatry

## 2022-10-14 DIAGNOSIS — F411 Generalized anxiety disorder: Secondary | ICD-10-CM | POA: Diagnosis not present

## 2022-10-14 DIAGNOSIS — F3181 Bipolar II disorder: Secondary | ICD-10-CM

## 2022-10-14 DIAGNOSIS — F5102 Adjustment insomnia: Secondary | ICD-10-CM | POA: Diagnosis not present

## 2022-10-14 DIAGNOSIS — F1290 Cannabis use, unspecified, uncomplicated: Secondary | ICD-10-CM | POA: Diagnosis not present

## 2022-10-14 MED ORDER — ARIPIPRAZOLE 5 MG PO TABS: 5.0000 mg | ORAL_TABLET | Freq: Every day | ORAL | 0 refills | Status: DC

## 2022-10-14 MED ORDER — DIVALPROEX SODIUM 500 MG PO DR TAB: 500.0000 mg | DELAYED_RELEASE_TABLET | Freq: Every day | ORAL | 0 refills | Status: DC

## 2022-10-14 MED ORDER — FLUOXETINE HCL 20 MG PO CAPS: 20.0000 mg | ORAL_CAPSULE | Freq: Every day | ORAL | 0 refills | Status: DC

## 2022-10-14 NOTE — Progress Notes (Signed)
[facility] Follow up visit  Patient Identification: Evelyn Booker MRN:  [id number] Date of Evaluation:  10/14/2022 Referral Source: hospital discharge Chief Complaint:   No chief complaint on file. Follow up depression  Visit Diagnosis:    ICD-10-CM   1. Bipolar 2 disorder, major depressive episode (HCC)  F31.81     2. GAD (generalized anxiety disorder)  F41.1     3. Adjustment insomnia  F51.02     Virtual Visit via Video Note  I connected with Evelyn Booker on 10/14/22 at  3:00 PM EDT by a video enabled telemedicine application and verified that I am speaking with the correct person using two identifiers.  Location: Patient: home Provider: home office   I discussed the limitations of evaluation and management by telemedicine and the availability of in person appointments. The patient expressed understanding and agreed to proceed.      I discussed the assessment and treatment plan with the patient. The patient was provided an opportunity to ask questions and all were answered. The patient agreed with the plan and demonstrated an understanding of the instructions.   The patient was advised to call back or seek an in-person evaluation if the symptoms worsen or if the condition fails to improve as anticipated.  I provided 20 minutes of non-face-to-face time during this encounter.    History of Present Illness: Patient is a 22 years old currently single African-American female was living with her mom and stepdad has had  hospital admission because of attempted suicide in  May friend was called by her and she got admitted She was going to [employer]. At that time.  Chart and discharge summary reviewed  Last visit after hospital discharge was doing fair still going thru some challenges from the breakup but was tolerating medications and was helping  Today states she got non compliant with meds or didn't get refill and feeling subdued and wants to get back on meds  Has changed school to  [employer] for different environment and from past relationship  It has helped but adapting to new college some stress there  She doesn't want to be on trazadone and vistaril feels sleep is fair but back on abilify and prozac, depakote to help with depression  Also working 2 jobs  Aggravating factors: recent admission, recent breakup with BF  Modifying factors; mom.  Some friends  Has cut down THC   Previous Psychotropic Medications: Yes   Substance Abuse History in the last 12 months:  Yes.    Consequences of Substance Abuse: THC effect on judjdment and depression discussed, now have reduced to one in two week , priro admission was using daily  Past Medical History:  Past Medical History:  Diagnosis Date   ADHD    Anxiety    Depression     Past Surgical History:  Procedure Laterality Date   BREAST BIOPSY      Family Psychiatric History: denies  Family History: History reviewed. No pertinent family history.  Social History:   Social History   Socioeconomic History   Marital status: Single    Spouse name: Not on file   Number of children: Not on file   Years of education: Not on file   Highest education level: Not on file  Occupational History   Not on file  Tobacco Use   Smoking status: Never   Smokeless tobacco: Never  Vaping Use   Vaping status: Some Days   Substances: THC  Substance and Sexual Activity  Alcohol use: Yes    Comment: rarely   Drug use: Yes    Types: Marijuana   Sexual activity: Not Currently  Other Topics Concern   Not on file  Social History Narrative   Not on file   Social Determinants of Health   Financial Resource Strain: Not on file  Food Insecurity: No Food Insecurity (06/02/2022)   Hunger Vital Sign    Worried About Running Out of Food in the Last Year: Never true    Ran Out of Food in the Last Year: Never true  Transportation Needs: No Transportation Needs (06/02/2022)   PRAPARE - Administrator, Civil Service  (Medical): No    Lack of Transportation (Non-Medical): No  Physical Activity: Not on file  Stress: Not on file  Social Connections: Not on file    Additional Social History: grew up with  grand Evelyn and mom, had to move a lot due to mom jobs. No otherwise abuse  Allergies:  No Known Allergies  Metabolic Disorder Labs: Lab Results  Component Value Date   HGBA1C 5.2 06/04/2022   MPG 102.54 06/04/2022   No results found for: "PROLACTIN" Lab Results  Component Value Date   CHOL 174 06/04/2022   TRIG 83 06/04/2022   HDL 53 06/04/2022   CHOLHDL 3.3 06/04/2022   VLDL 17 06/04/2022   LDLCALC 104 (H) 06/04/2022   Lab Results  Component Value Date   TSH 0.538 06/04/2022    Therapeutic Level Labs: No results found for: "LITHIUM" No results found for: "CBMZ" Lab Results  Component Value Date   VALPROATE 66 06/09/2022    Current Medications: Current Outpatient Medications  Medication Sig Dispense Refill   ARIPiprazole (ABILIFY) 5 MG tablet Take 1 tablet (5 mg total) by mouth daily. 30 tablet 0   divalproex (DEPAKOTE) 500 MG DR tablet Take 1 tablet (500 mg total) by mouth at bedtime. 30 tablet 0   FLUoxetine (PROZAC) 20 MG capsule Take 1 capsule (20 mg total) by mouth daily. 30 capsule 0   levonorgestrel-ethinyl estradiol (ALESSE) 0.1-20 MG-MCG tablet Take 1 tablet by mouth daily.     propranolol (INDERAL) 10 MG tablet Take 1 tablet (10 mg total) by mouth daily. 30 tablet 0   Vitamin D, Ergocalciferol, (DRISDOL) 1.25 MG (50000 UNIT) CAPS capsule Take 1 capsule (50,000 Units total) by mouth every 7 (seven) days. 5 capsule 0   No current facility-administered medications for this visit.     Psychiatric Specialty Exam: Review of Systems  Cardiovascular:  Negative for chest pain.  Neurological:  Negative for tremors.  Psychiatric/Behavioral:  Positive for dysphoric mood. Negative for self-injury.     There were no vitals taken for this visit.There is no height or weight on  file to calculate BMI.  General Appearance: Casual  Eye Contact:  Good  Speech:  Slow  Volume:  Normal  Mood:  somewhat subdued  Affect:  Constricted  Thought Process:  Goal Directed  Orientation:  Full (Time, Place, and Person)  Thought Content:  Rumination  Suicidal Thoughts:  No  Homicidal Thoughts:  No  Memory:  Recent;   Fair  Judgement:  Fair  Insight:  Shallow  Psychomotor Activity:  Normal  Concentration:  Concentration: Fair  Recall:  Fair  Fund of Knowledge:Good  Language: Good  Akathisia:  No  Handed:    AIMS (if indicated):  no involuntary movements  Assets:  Desire for Improvement  ADL's:  Intact  Cognition: WNL  Sleep:  Good   Screenings: AUDIT    Flowsheet Row [facility]  Alcohol Use Disorder Identification Test Final Score (AUDIT) 0      PHQ2-9    Flowsheet Row [facility]  PHQ-2 Total Score 1      Flowsheet Row Office Visit [facility]/20/2022 [facility] Admission (Discharged) [facility] from [facility]01/2022 [facility]  C-SSRS RISK CATEGORY Error: Q3, 4, or 5 should not be populated when Q2 is No High Risk High Risk       Assessment and Plan: as follows  Prior documentation reviewed   Bipolar disorder, 2; current episode depressed:  Subdued, restart meds, discussed compliance, start abilify no tremors Depakote and prozac  Seldom takes inderal   GAD: stress related to stress, restart prozac,   Insomnia:managing It fair without trazaodne, work on sleep hygiene   THC use: cutting it down, discussed risk of Substance use   Follow-up in 4 weeks, or earlier if needed questions addressed medication sent as refills and discussed Collaboration of Care: Other  hospital discharge and notes reviewed  Patient/Guardian was advised Release of Information must be obtained prior to any record release in order to collaborate their care with an outside provider. Patient/Guardian was advised if they have not already done so to contact the registration department to sign all necessary forms in order for [place] to release information regarding their care.   Consent: Patient/Guardian gives verbal consent for treatment and assignment of benefits for services provided during this visit. Patient/Guardian expressed understanding and agreed to proceed.   Thresa Ross, MD 9/4/20243:01 PM

## 2022-11-18 ENCOUNTER — Telehealth (INDEPENDENT_AMBULATORY_CARE_PROVIDER_SITE_OTHER): Payer: BC Managed Care – PPO | Admitting: Psychiatry

## 2022-11-18 ENCOUNTER — Encounter (HOSPITAL_COMMUNITY): Payer: Self-pay | Admitting: Psychiatry

## 2022-11-18 DIAGNOSIS — F5102 Adjustment insomnia: Secondary | ICD-10-CM

## 2022-11-18 DIAGNOSIS — F1290 Cannabis use, unspecified, uncomplicated: Secondary | ICD-10-CM

## 2022-11-18 DIAGNOSIS — F3181 Bipolar II disorder: Secondary | ICD-10-CM

## 2022-11-18 DIAGNOSIS — F411 Generalized anxiety disorder: Secondary | ICD-10-CM

## 2022-11-18 MED ORDER — DIVALPROEX SODIUM 500 MG PO DR TAB: 500.0000 mg | DELAYED_RELEASE_TABLET | Freq: Every day | ORAL | 2 refills | Status: DC

## 2022-11-18 MED ORDER — FLUOXETINE HCL 20 MG PO CAPS: 20.0000 mg | ORAL_CAPSULE | Freq: Every day | ORAL | 2 refills | Status: DC

## 2022-11-18 MED ORDER — ARIPIPRAZOLE 5 MG PO TABS: 5.0000 mg | ORAL_TABLET | Freq: Every day | ORAL | 2 refills | Status: DC

## 2022-11-18 NOTE — Progress Notes (Signed)
[facility] Follow up visit  Patient Identification: Evelyn Booker MRN:  [id number] Date of Evaluation:  11/18/2022 Referral Source: hospital discharge Chief Complaint:   No chief complaint on file. Follow up depression  Visit Diagnosis:    ICD-10-CM   1. Bipolar 2 disorder, major depressive episode (HCC)  F31.81     2. GAD (generalized anxiety disorder)  F41.1     3. Adjustment insomnia  F51.02       Virtual Visit via Video Note  I connected with Evelyn Booker on 11/18/22 at  3:00 PM EDT by a video enabled telemedicine application and verified that I am speaking with the correct person using two identifiers.  Location: Patient: home Provider: home office   I discussed the limitations of evaluation and management by telemedicine and the availability of in person appointments. The patient expressed understanding and agreed to proceed.      I discussed the assessment and treatment plan with the patient. The patient was provided an opportunity to ask questions and all were answered. The patient agreed with the plan and demonstrated an understanding of the instructions.   The patient was advised to call back or seek an in-person evaluation if the symptoms worsen or if the condition fails to improve as anticipated.  I provided 18 minutes of non-face-to-face time during this encounter.    History of Present Illness: Patient is a 22 years old currently single African-American female was living with her mom and stepdad has had  hospital admission because of attempted suicide in  May friend was called by her and she got admitted She was going to [employer]. At that time.    She has moved to [facility] and doing fair, last visit meds were restarted as she was non compliant after hospital admission Now on abiliyf, depakote and prozac  Some school stress but has moved on from the breakup  Decrease motivation at time and she plans to get testing done for ADHD  Says has cut down [facility] Not on  trazadone or inderal, it was making her more foggy or didn't help  Aggravating factors: 2024 hospital admission, recent breakup with BF  Modifying factors; mom.  some friends  Has cut down THC   Previous Psychotropic Medications: Yes   Substance Abuse History in the last 12 months:  Yes.    Consequences of Substance Abuse: THC effect on judjdment and depression discussed, now have reduced to one in two week , priro admission was using daily  Past Medical History:  Past Medical History:  Diagnosis Date   ADHD    Anxiety    Depression     Past Surgical History:  Procedure Laterality Date   BREAST BIOPSY      Family Psychiatric History: denies  Family History: History reviewed. No pertinent family history.  Social History:   Social History   Socioeconomic History   Marital status: Single    Spouse name: Not on file   Number of children: Not on file   Years of education: Not on file   Highest education level: Not on file  Occupational History   Not on file  Tobacco Use   Smoking status: Never   Smokeless tobacco: Never  Vaping Use   Vaping status: Some Days   Substances: THC  Substance and Sexual Activity   Alcohol use: Yes    Comment: rarely   Drug use: Yes    Types: Marijuana   Sexual activity: Not Currently  Other Topics Concern  Not on file  Social History Narrative   Not on file   Social Determinants of Health   Financial Resource Strain: Not on file  Food Insecurity: No Food Insecurity (06/02/2022)   Hunger Vital Sign    Worried About Running Out of Food in the Last Year: Never true    Ran Out of Food in the Last Year: Never true  Transportation Needs: No Transportation Needs (06/02/2022)   PRAPARE - Administrator, Civil Service (Medical): No    Lack of Transportation (Non-Medical): No  Physical Activity: Not on file  Stress: Not on file  Social Connections: Not on file    Additional Social History: grew up with  grand MA and mom,  had to move a lot due to mom jobs. No otherwise abuse  Allergies:  No Known Allergies  Metabolic Disorder Labs: Lab Results  Component Value Date   HGBA1C 5.2 06/04/2022   MPG 102.54 06/04/2022   No results found for: "PROLACTIN" Lab Results  Component Value Date   CHOL 174 06/04/2022   TRIG 83 06/04/2022   HDL 53 06/04/2022   CHOLHDL 3.3 06/04/2022   VLDL 17 06/04/2022   LDLCALC 104 (H) 06/04/2022   Lab Results  Component Value Date   TSH 0.538 06/04/2022    Therapeutic Level Labs: No results found for: "LITHIUM" No results found for: "CBMZ" Lab Results  Component Value Date   VALPROATE 66 06/09/2022    Current Medications: Current Outpatient Medications  Medication Sig Dispense Refill   ARIPiprazole (ABILIFY) 5 MG tablet Take 1 tablet (5 mg total) by mouth daily. 30 tablet 2   divalproex (DEPAKOTE) 500 MG DR tablet Take 1 tablet (500 mg total) by mouth at bedtime. 30 tablet 2   FLUoxetine (PROZAC) 20 MG capsule Take 1 capsule (20 mg total) by mouth daily. 30 capsule 2   levonorgestrel-ethinyl estradiol (ALESSE) 0.1-20 MG-MCG tablet Take 1 tablet by mouth daily.     Vitamin D, Ergocalciferol, (DRISDOL) 1.25 MG (50000 UNIT) CAPS capsule Take 1 capsule (50,000 Units total) by mouth every 7 (seven) days. 5 capsule 0   No current facility-administered medications for this visit.     Psychiatric Specialty Exam: Review of Systems  Cardiovascular:  Negative for chest pain.  Neurological:  Negative for tremors.  Psychiatric/Behavioral:  Negative for self-injury.     There were no vitals taken for this visit.There is no height or weight on file to calculate BMI.  General Appearance: Casual  Eye Contact:  Good  Speech:  Slow  Volume:  Normal  Mood:  fair  Affect:  Constricted  Thought Process:  Goal Directed  Orientation:  Full (Time, Place, and Person)  Thought Content:  Rumination  Suicidal Thoughts:  No  Homicidal Thoughts:  No  Memory:  Recent;   Fair   Judgement:  Fair  Insight:  Shallow  Psychomotor Activity:  Normal  Concentration:  Concentration: Fair  Recall:  Fair  Fund of Knowledge:Good  Language: Good  Akathisia:  No  Handed:    AIMS (if indicated):  no involuntary movements  Assets:  Desire for Improvement  ADL's:  Intact  Cognition: WNL  Sleep:  Good   Screenings: AUDIT    Flowsheet Row [facility]  Alcohol Use Disorder Identification Test Final Score (AUDIT) 0      PHQ2-9    Flowsheet Row Office Visit [facility]20/[facility]  PHQ-2  Total Score 1      Flowsheet Row Office Visit [facility] 06/20/2022 [facility] Admission (Discharged) [facility] from [facility]01/2022 in [facility]  C-SSRS RISK CATEGORY Error: Q3, 4, or 5 should not be populated when Q2 is No High Risk High Risk       Assessment and Plan: as follows  Prior documentation reviewed   Bipolar disorder, 2; current episode depressed:  Manageable with abilify, depakote, will continue  No tremors, not on inderal or not taking  GAD: fluctuates but has college stress. Continue prozac   Insomnia: fair , not on trazadone, continue sleep hygiene  Discussed time management to help with getting chores planned and done   [facility] use: cutting it down, discussed risk of Substance use   Fu 2 m.  Collaboration of Care: Other hospital discharge and notes reviewed  Patient/Guardian was advised Release of Information must be obtained prior to any record release in order to collaborate their care with an outside provider. Patient/Guardian was advised if they have not already done so to contact the registration department to sign all necessary forms in order for [place] to release information regarding their care.    Consent: Patient/Guardian gives verbal consent for treatment and assignment of benefits for services provided during this visit. Patient/Guardian expressed understanding and agreed to proceed.   Thresa Ross, MD 10/9/20243:01 PM

## 2023-01-18 ENCOUNTER — Telehealth (INDEPENDENT_AMBULATORY_CARE_PROVIDER_SITE_OTHER): Payer: BC Managed Care – PPO | Admitting: Psychiatry

## 2023-01-18 ENCOUNTER — Encounter (HOSPITAL_COMMUNITY): Payer: Self-pay | Admitting: Psychiatry

## 2023-01-18 DIAGNOSIS — F3181 Bipolar II disorder: Secondary | ICD-10-CM | POA: Diagnosis not present

## 2023-01-18 DIAGNOSIS — F5102 Adjustment insomnia: Secondary | ICD-10-CM | POA: Diagnosis not present

## 2023-01-18 DIAGNOSIS — F411 Generalized anxiety disorder: Secondary | ICD-10-CM

## 2023-01-18 MED ORDER — DIVALPROEX SODIUM 500 MG PO DR TAB: 500.0000 mg | DELAYED_RELEASE_TABLET | Freq: Every day | ORAL | 2 refills | Status: DC

## 2023-01-18 MED ORDER — ARIPIPRAZOLE 5 MG PO TABS: 5.0000 mg | ORAL_TABLET | Freq: Every day | ORAL | 2 refills | Status: DC

## 2023-01-18 MED ORDER — FLUOXETINE HCL 10 MG PO CAPS: 10.0000 mg | ORAL_CAPSULE | Freq: Every day | ORAL | 0 refills | Status: DC

## 2023-01-18 MED ORDER — FLUOXETINE HCL 20 MG PO CAPS: 20.0000 mg | ORAL_CAPSULE | Freq: Every day | ORAL | 2 refills | Status: DC

## 2023-01-18 NOTE — Progress Notes (Signed)
[facility] Follow up visit  Patient Identification: Evelyn Booker MRN:  [id number] Date of Evaluation:  01/18/2023 Referral Source: hospital discharge Chief Complaint:   No chief complaint on file. Follow up depression  Visit Diagnosis:    ICD-10-CM   1. Bipolar 2 disorder, major depressive episode (HCC)  F31.81 Valproic acid level    2. GAD (generalized anxiety disorder)  F41.1     3. Adjustment insomnia  F51.02     Virtual Visit via Video Note  I connected with Evelyn Booker on 01/18/23 at 11:00 AM EST by a video enabled telemedicine application and verified that I am speaking with the correct person using two identifiers.  Location: Patient: home Provider: home office   I discussed the limitations of evaluation and management by telemedicine and the availability of in person appointments. The patient expressed understanding and agreed to proceed.      I discussed the assessment and treatment plan with the patient. The patient was provided an opportunity to ask questions and all were answered. The patient agreed with the plan and demonstrated an understanding of the instructions.   The patient was advised to call back or seek an in-person evaluation if the symptoms worsen or if the condition fails to improve as anticipated.  I provided 20 minutes of non-face-to-face time during this encounter.    History of Present Illness: Patient is a 22 years old currently single African-American female was living with her mom and stepdad has had  hospital admission because of attempted suicide in  May friend was called by her and she got admitted She was going to [employer] college. At that time.    She has moved to [facility] and was doing fair last visit, complieance was discussed and to abstain from [facility]  On eval feeling subdued at times or not motivated to do college work and also working Now in therapy with [facility]  Not hopeless but not motivated with school Planning to have roomates in  December and looks forward to that , wants ESA letter for cat  Still uses [facility] understands the risk of amotivation with that VPA level not done, will request to lab today   Aggravating factors: 2024 hospital admission, breakup with BF Modifying factors; mom.  Some friends     Previous Psychotropic Medications: Yes   Substance Abuse History in the last 12 months:  Yes.    Consequences of Substance Abuse: THC effect on judjdment and depression discussed, now have reduced to one in two week , priro admission was using daily  Past Medical History:  Past Medical History:  Diagnosis Date   ADHD    Anxiety    Depression     Past Surgical History:  Procedure Laterality Date   BREAST BIOPSY      Family Psychiatric History: denies  Family History: History reviewed. No pertinent family history.  Social History:   Social History   Socioeconomic History   Marital status: Single    Spouse name: Not on file   Number of children: Not on file   Years of education: Not on file   Highest education level: Not on file  Occupational History   Not on file  Tobacco Use   Smoking status: Never   Smokeless tobacco: Never  Vaping Use   Vaping status: Some Days   Substances: THC  Substance and Sexual Activity   Alcohol use: Yes    Comment: rarely   Drug use: Yes    Types: Marijuana  Sexual activity: Not Currently  Other Topics Concern   Not on file  Social History Narrative   Not on file   Social Determinants of Health   Financial Resource Strain: Not on file  Food Insecurity: Low Risk  (11/27/2022)   Received from [facility]   Hunger Vital Sign    Worried About Running Out of Food in the Last Year: Never true    Ran Out of Food in the Last Year: Never true  Transportation Needs: No Transportation Needs (11/27/2022)   Received from [facility]    In the past 12 months, has lack of reliable transportation kept you from medical appointments, meetings,  work or from getting things needed for daily living? : No  Physical Activity: Not on file  Stress: Not on file  Social Connections: Not on file    Additional Social History: grew up with  grand MA and mom, had to move a lot due to mom jobs. No otherwise abuse  Allergies:  No Known Allergies  Metabolic Disorder Labs: Lab Results  Component Value Date   HGBA1C 5.2 06/04/2022   MPG 102.54 06/04/2022   No results found for: "PROLACTIN" Lab Results  Component Value Date   CHOL 174 06/04/2022   TRIG 83 06/04/2022   HDL 53 06/04/2022   CHOLHDL 3.3 06/04/2022   VLDL 17 06/04/2022   LDLCALC 104 (H) 06/04/2022   Lab Results  Component Value Date   TSH 0.538 06/04/2022    Therapeutic Level Labs: No results found for: "LITHIUM" No results found for: "CBMZ" Lab Results  Component Value Date   VALPROATE 66 06/09/2022    Current Medications: Current Outpatient Medications  Medication Sig Dispense Refill   FLUoxetine (PROZAC) 10 MG capsule Take 1 capsule (10 mg total) by mouth daily. 30 capsule 0   ARIPiprazole (ABILIFY) 5 MG tablet Take 1 tablet (5 mg total) by mouth daily. 30 tablet 2   divalproex (DEPAKOTE) 500 MG DR tablet Take 1 tablet (500 mg total) by mouth at bedtime. 30 tablet 2   FLUoxetine (PROZAC) 20 MG capsule Take 1 capsule (20 mg total) by mouth daily. 30 capsule 2   levonorgestrel-ethinyl estradiol (ALESSE) 0.1-20 MG-MCG tablet Take 1 tablet by mouth daily.     Vitamin D, Ergocalciferol, (DRISDOL) 1.25 MG (50000 UNIT) CAPS capsule Take 1 capsule (50,000 Units total) by mouth every 7 (seven) days. 5 capsule 0   No current facility-administered medications for this visit.     Psychiatric Specialty Exam: Review of Systems  Cardiovascular:  Negative for chest pain.  Neurological:  Negative for tremors.  Psychiatric/Behavioral:  Positive for decreased concentration and dysphoric mood. Negative for self-injury.     There were no vitals taken for this visit.There  is no height or weight on file to calculate BMI.  General Appearance: Casual  Eye Contact:  Good  Speech:  Slow  Volume:  Normal  Mood:  somewhat subdued  Affect:  Constricted  Thought Process:  Goal Directed  Orientation:  Full (Time, Place, and Person)  Thought Content:  Rumination  Suicidal Thoughts:  No  Homicidal Thoughts:  No  Memory:  Recent;   Fair  Judgement:  Fair  Insight:  Shallow  Psychomotor Activity:  Normal  Concentration:  Concentration: Fair  Recall:  Fair  Fund of Knowledge:Good  Language: Good  Akathisia:  No  Handed:    AIMS (if indicated):  no involuntary movements  Assets:  Desire for Improvement  ADL's:  Intact  Cognition: WNL  Sleep:  Good   Screenings: AUDIT    Flowsheet Row [facility]  Alcohol Use Disorder Identification Test Final Score (AUDIT) 0      PHQ2-9    Flowsheet Row [facility]  PHQ-2 Total Score 1      Flowsheet Row Office Visit [facility]20[facility]2022 [facility] Admission ([facility] from [facility]2022 [facility]  C-SSRS RISK CATEGORY Error: Q3, 4, or 5 should not be populated when Q2 is No High Risk High Risk       Assessment and Plan: as follows  Prior documentation reviewed   Bipolar disorder, 2; current episode depressed:  Somewhat subdued, increase prozac to 30mg   Continue thearpy and avoid THC Will request for VPA blood work   GAD: stress related to college, continue therapy and increase meds but discussed to keep up with college studies to not fall behind. Avoid THC    Insomnia: did not endorse and not on trazadone now, continue sleep hygiene  Discussed time management to help with getting chores planned and  done   [facility] use: cutting it down, discussed risk of Substance use   Fu 2 m.  Collaboration of Care: Other hospital discharge and notes reviewed  Patient/Guardian was advised Release of Information must be obtained prior to any record release in order to collaborate their care with an outside provider. Patient/Guardian was advised if they have not already done so to contact the registration department to sign all necessary forms in order for [place] to release information regarding their care.   Consent: Patient/Guardian gives verbal consent for treatment and assignment of benefits for services provided during this visit. Patient/Guardian expressed understanding and agreed to proceed.   Thresa Ross, MD 12/9/202411:14 AM

## 2023-03-17 ENCOUNTER — Other Ambulatory Visit: Payer: Self-pay

## 2023-03-17 ENCOUNTER — Emergency Department (HOSPITAL_COMMUNITY)
Admission: EM | Admit: 2023-03-17 | Discharge: 2023-03-18 | Disposition: A | Discharge status: Other Acute Inpatient Hospital | Payer: 59 | Attending: Emergency Medicine | Admitting: Emergency Medicine

## 2023-03-17 ENCOUNTER — Encounter (HOSPITAL_COMMUNITY): Payer: Self-pay | Admitting: Emergency Medicine

## 2023-03-17 DIAGNOSIS — F332 Major depressive disorder, recurrent severe without psychotic features: Secondary | ICD-10-CM | POA: Diagnosis present

## 2023-03-17 DIAGNOSIS — R45851 Suicidal ideations: Secondary | ICD-10-CM | POA: Insufficient documentation

## 2023-03-17 LAB — COMPREHENSIVE METABOLIC PANEL
ALT: 14 U/L (ref 0–44)
AST: 19 U/L (ref 15–41)
Albumin: 3.9 g/dL (ref 3.5–5.0)
Alkaline Phosphatase: 59 U/L (ref 38–126)
Anion gap: 9 (ref 5–15)
BUN: 11 mg/dL (ref 6–20)
CO2: 22 mmol/L (ref 22–32)
Calcium: 9 mg/dL (ref 8.9–10.3)
Chloride: 106 mmol/L (ref 98–111)
Creatinine, Ser: 0.75 mg/dL (ref 0.44–1.00)
GFR, Estimated: >60 mL/min (ref >60)
Glucose, Bld: 87 mg/dL (ref 70–99)
Potassium: 3.6 mmol/L (ref 3.5–5.1)
Sodium: 137 mmol/L (ref 135–145)
Total Bilirubin: 0.5 mg/dL (ref 0.0–1.2)
Total Protein: 7.6 g/dL (ref 6.5–8.1)

## 2023-03-17 LAB — CBC
HCT: 39.6 % (ref 36.0–46.0)
Hemoglobin: 12.2 g/dL (ref 12.0–15.0)
MCH: 26.8 pg (ref 26.0–34.0)
MCHC: 30.8 g/dL (ref 30.0–36.0)
MCV: 87 fL (ref 80.0–100.0)
Platelets: 241 10*3/uL (ref 150–400)
RBC: 4.55 MIL/uL (ref 3.87–5.11)
RDW: 12.8 % (ref 11.5–15.5)
WBC: 6.1 10*3/uL (ref 4.0–10.5)
nRBC: 0 % (ref 0.0–0.2)

## 2023-03-17 LAB — ETHANOL: Alcohol, Ethyl (B): <10 mg/dL (ref <10)

## 2023-03-17 LAB — ACETAMINOPHEN LEVEL: Acetaminophen (Tylenol), Serum: <10 ug/mL — ABNORMAL LOW (ref 10–30)

## 2023-03-17 LAB — SALICYLATE LEVEL: Salicylate Lvl: <7 mg/dL — ABNORMAL LOW (ref 7.0–30.0)

## 2023-03-17 LAB — HCG, SERUM, QUALITATIVE: Preg, Serum: NEGATIVE

## 2023-03-17 NOTE — ED Provider Notes (Signed)
 [place] EMERGENCY DEPARTMENT AT [facility] Provider Note   CSN: [id number] Arrival date & time: 03/17/23  2135     History  Chief Complaint  Patient presents with   Suicidal    Evelyn Booker is a 23 y.o. female history of suicide attempts, homicidal ideation, bipolar 2, GAD presented with suicidal ideation for the past few months.  Patient states that she has not been taking her medications at home which include Depakote , Abilify , Prozac  as she forgets and does not restart them when she forgets.  Patient states that she daydreams by driving off the road and may be getting a gun.  Patient does not have any source or trigger for these thoughts.  Patient has not been seeing her psychiatrist.  Patient is having active SI and states that she is only here because her monitor coming out.  Patient denies hallucinations or HI  Home Medications Prior to Admission medications   Medication Sig Start Date End Date Taking? Authorizing Provider  ARIPiprazole  (ABILIFY ) 5 MG tablet Take 1 tablet (5 mg total) by mouth daily. Patient not taking: Reported on 03/17/2023 01/18/23   Geralene Kaiser, MD  divalproex  (DEPAKOTE ) 500 MG DR tablet Take 500 mg by mouth at bedtime. Patient not taking: Reported on 03/17/2023 11/18/22   [provider]  FLUoxetine  (PROZAC ) 20 MG capsule Take 1 capsule (20 mg total) by mouth daily. Patient not taking: Reported on 03/17/2023 01/18/23   Geralene Kaiser, MD      Allergies    Patient has no known allergies.    Review of Systems   Review of Systems  Physical Exam Updated Vital Signs BP (!) 130/98 (BP Location: Left Arm)   Pulse 84   Temp 99 F (37.2 C) (Oral)   Resp 20   Ht 5' 8 (1.727 m)   Wt 104.3 kg   SpO2 100%   BMI 34.97 kg/m  Physical Exam Vitals reviewed.  Constitutional:      General: She is not in acute distress. HENT:     Head: Normocephalic and atraumatic.  Eyes:     Extraocular Movements: Extraocular movements intact.      Conjunctiva/sclera: Conjunctivae normal.     Pupils: Pupils are equal, round, and reactive to light.  Cardiovascular:     Rate and Rhythm: Normal rate and regular rhythm.     Pulses: Normal pulses.     Heart sounds: Normal heart sounds.     Comments: 2+ bilateral radial/dorsalis pedis pulses with regular rate Pulmonary:     Effort: Pulmonary effort is normal. No respiratory distress.     Breath sounds: Normal breath sounds.  Abdominal:     Palpations: Abdomen is soft.     Tenderness: There is no abdominal tenderness. There is no guarding or rebound.  Musculoskeletal:        General: Normal range of motion.     Cervical back: Normal range of motion and neck supple.     Comments: 5 out of 5 bilateral grip/leg extension strength  Skin:    General: Skin is warm and dry.     Capillary Refill: Capillary refill takes less than 2 seconds.  Neurological:     General: No focal deficit present.     Mental Status: She is alert and oriented to person, place, and time.     Comments: Sensation intact in all 4 limbs  Psychiatric:     Comments: Actively endorsing SI Denies HI     ED Results / Procedures /  Treatments   Labs (all labs ordered are listed, but only abnormal results are displayed) Labs Reviewed  SALICYLATE LEVEL - Abnormal; Notable for the following components:      Result Value   Salicylate Lvl <7.0 (*)    All other components within normal limits  ACETAMINOPHEN  LEVEL - Abnormal; Notable for the following components:   Acetaminophen  (Tylenol ), Serum <10 (*)    All other components within normal limits  RAPID URINE DRUG SCREEN, HOSP PERFORMED - Abnormal; Notable for the following components:   Tetrahydrocannabinol POSITIVE (*)    All other components within normal limits  COMPREHENSIVE METABOLIC PANEL  ETHANOL  CBC  HCG, SERUM, QUALITATIVE    EKG None  Radiology No results found.  Procedures Procedures    Medications Ordered in ED Medications  melatonin tablet  3 mg (has no administration in time range)    ED Course/ Medical Decision Making/ A&P                                 Medical Decision Making Amount and/or Complexity of Data Reviewed Labs: ordered.  Risk OTC drugs.   Evelyn Booker 23 y.o. presented today for psych evaluation. Working DDx that I considered at this time includes, but not limited to, primary psychosis, substance-induced psychosis, mood disturbance, SI/HI.  R/o DDx: primary psychosis, substance-induced psychosis, mood disturbance, HI: These are considered less likely due to history of present illness, physical exam, labs/imaging findings  Review of prior external notes: 01/18/2023 video visit  Unique Tests and My Independent Interpretation:  CBC: Unremarkable CMP: Unremarkable UDS: THC positive Salicylate level: Unremarkable Ethanol: Unremarkable APAP: Unremarkable hCG serum qualitative: Negative  Social Determinants of Health: none  Discussion with Independent Historian: None  Discussion of Management of Tests: None  Risk: High: hospitalization or escalation of hospital-level care  Risk Stratification Score: none  Plan: Patient presented for psychiatric evaluation.  Patient is endorsing SI.  Patient does have a history of SI with suicide attempts for which they are on Depakote , Prozac , Abilify .  They are not compliant with their medications. On my initial exam, the pt was linear in thought, appropriate in affect, and overall well-appearing.  Vital signs reviewed and reassuring. With the patient's presentation of SI, patient warrants emergent psychiatric consultation.   Patient immediately placed into ED psychiatric hold protocol including suicide precautions, elopement precautions and vital sign monitoring. Emergent behavioral health hold signed and notarized while awaiting psychiatric consultation due to threat to self or others. TTS consulted for further evaluation once patient medically cleared. Medical  screening evaluation ordered and reviewed with no obvious medical reason to postpone psychiatric evaluation. May need to be reassessed if capacity is changing.  Patient is medically cleared.  This chart was dictated using voice recognition software.  Despite best efforts to proofread,  errors can occur which can change the documentation meaning.         Final Clinical Impression(s) / ED Diagnoses Final diagnoses:  Suicidal ideation    Rx / DC Orders ED Discharge Orders     None         Victor Lynwood ONEIDA DEVONNA 03/18/23 0019    Charlyn Sora, MD 03/18/23 [phone]

## 2023-03-17 NOTE — ED Triage Notes (Signed)
 Pt reports she was being transparent with my mom tonight about feeling suicidal and she brought me here  Pt reports to this RN she has been off of all of her medications x 1 month, has not been attending therapy.  Pt reports she has thoughts of driving off the road or maybe getting a gun  to end her life.  Pt reports no specific factor caused her to reach this point but it was just a build up of things

## 2023-03-18 ENCOUNTER — Encounter (HOSPITAL_COMMUNITY): Payer: Self-pay | Admitting: Nurse Practitioner

## 2023-03-18 ENCOUNTER — Inpatient Hospital Stay (HOSPITAL_COMMUNITY)
Admission: AD | Admit: 2023-03-18 | Discharge: 2023-03-23 | DRG: 885 | Disposition: A | Discharge status: Home / Self Care | Payer: 59 | Source: Intra-hospital | Attending: Psychiatry | Admitting: Psychiatry

## 2023-03-18 DIAGNOSIS — F411 Generalized anxiety disorder: Secondary | ICD-10-CM | POA: Diagnosis present

## 2023-03-18 DIAGNOSIS — F332 Major depressive disorder, recurrent severe without psychotic features: Secondary | ICD-10-CM | POA: Diagnosis not present

## 2023-03-18 DIAGNOSIS — F17290 Nicotine dependence, other tobacco product, uncomplicated: Secondary | ICD-10-CM | POA: Diagnosis present

## 2023-03-18 DIAGNOSIS — Z91148 Patient's other noncompliance with medication regimen for other reason: Secondary | ICD-10-CM | POA: Diagnosis not present

## 2023-03-18 DIAGNOSIS — R45851 Suicidal ideations: Secondary | ICD-10-CM | POA: Diagnosis present

## 2023-03-18 DIAGNOSIS — F909 Attention-deficit hyperactivity disorder, unspecified type: Secondary | ICD-10-CM | POA: Diagnosis present

## 2023-03-18 DIAGNOSIS — Z79899 Other long term (current) drug therapy: Secondary | ICD-10-CM | POA: Diagnosis not present

## 2023-03-18 DIAGNOSIS — F333 Major depressive disorder, recurrent, severe with psychotic symptoms: Secondary | ICD-10-CM | POA: Diagnosis present

## 2023-03-18 LAB — RAPID URINE DRUG SCREEN, HOSP PERFORMED
Amphetamines: NOT DETECTED
Barbiturates: NOT DETECTED
Benzodiazepines: NOT DETECTED
Cocaine: NOT DETECTED
Opiates: NOT DETECTED
Tetrahydrocannabinol: POSITIVE — AB

## 2023-03-18 MED ORDER — MAGNESIUM HYDROXIDE 400 MG/5ML PO SUSP
30.0000 mL | Freq: Every day | ORAL | Status: DC | PRN
  Administered 2023-03-20: 30 mL via ORAL
  Filled 2023-03-18 (×2): qty 30

## 2023-03-18 MED ORDER — ACETAMINOPHEN 325 MG PO TABS
650.0000 mg | ORAL_TABLET | Freq: Four times a day (QID) | ORAL | Status: DC | PRN
  Administered 2023-03-21: 650 mg via ORAL
  Filled 2023-03-18: qty 2

## 2023-03-18 MED ORDER — MELATONIN 3 MG PO TABS
3.0000 mg | ORAL_TABLET | Freq: Every day | ORAL | Status: DC
  Administered 2023-03-18: 3 mg via ORAL
  Filled 2023-03-18: qty 1

## 2023-03-18 MED ORDER — DIPHENHYDRAMINE HCL 25 MG PO CAPS: 50.0000 mg | ORAL_CAPSULE | Freq: Three times a day (TID) | ORAL | Status: DC | PRN

## 2023-03-18 MED ORDER — LORAZEPAM 2 MG/ML IJ SOLN: 2.0000 mg | Freq: Three times a day (TID) | INTRAMUSCULAR | Status: DC | PRN

## 2023-03-18 MED ORDER — TRAZODONE HCL 50 MG PO TABS
50.0000 mg | ORAL_TABLET | Freq: Every evening | ORAL | Status: DC | PRN
  Administered 2023-03-18 – 2023-03-22 (×5): 50 mg via ORAL
  Filled 2023-03-18 (×5): qty 1

## 2023-03-18 MED ORDER — HALOPERIDOL LACTATE 5 MG/ML IJ SOLN: 10.0000 mg | Freq: Three times a day (TID) | INTRAMUSCULAR | Status: DC | PRN

## 2023-03-18 MED ORDER — HALOPERIDOL LACTATE 5 MG/ML IJ SOLN: 5.0000 mg | Freq: Three times a day (TID) | INTRAMUSCULAR | Status: DC | PRN

## 2023-03-18 MED ORDER — ALUM & MAG HYDROXIDE-SIMETH 200-200-20 MG/5ML PO SUSP: 30.0000 mL | ORAL | Status: DC | PRN

## 2023-03-18 MED ORDER — HYDROXYZINE HCL 25 MG PO TABS
25.0000 mg | ORAL_TABLET | Freq: Three times a day (TID) | ORAL | Status: DC | PRN
  Administered 2023-03-18 – 2023-03-22 (×4): 25 mg via ORAL
  Filled 2023-03-18 (×4): qty 1

## 2023-03-18 MED ORDER — DIPHENHYDRAMINE HCL 50 MG/ML IJ SOLN: 50.0000 mg | Freq: Three times a day (TID) | INTRAMUSCULAR | Status: DC | PRN

## 2023-03-18 MED ORDER — WHITE PETROLATUM EX OINT
TOPICAL_OINTMENT | CUTANEOUS | Status: AC
  Administered 2023-03-18: 1
  Filled 2023-03-18: qty 10

## 2023-03-18 MED ORDER — HALOPERIDOL 5 MG PO TABS: 5.0000 mg | ORAL_TABLET | Freq: Three times a day (TID) | ORAL | Status: DC | PRN

## 2023-03-18 NOTE — ED Notes (Signed)
[facility]iff transport notified

## 2023-03-18 NOTE — BH Assessment (Signed)
 Comprehensive Clinical Assessment (CCA) Note  03/18/2023 Evelyn Booker [id number]  Disposition:  Per Richerd Ivans, NP, patient is recommended for inpatient treatment  The patient demonstrates the following risk factors for suicide: Chronic risk factors for suicide include: psychiatric disorder of depression, substance use disorder, and previous suicide attempts x1 . Acute risk factors for suicide include: social withdrawal/isolation and failing in school . Protective factors for this patient include: hope for the future. Considering these factors, the overall suicide risk at this point appears to be high. Patient is not appropriate for outpatient follow up.   AUDIT    Flowsheet Row [facility]  Alcohol Use Disorder Identification Test Final Score (AUDIT) 0      PHQ2-9    Flowsheet Row ED from 03/17/2023 in [facility] at [facility] Office Visit from [facility]  PHQ-2 Total Score 4 1  PHQ-9 Total Score 18 --      Flowsheet Row ED from 03/17/2023 in [facility] at [facility] Office Visit from [facility]/20[facility]20[facility] Admission (Discharged) [facility]  C-SSRS RISK CATEGORY High Risk Error: Q3, 4, or 5 should not be populated when Q2 is No High Risk       Chief Complaint:  Chief Complaint  Patient presents with   Suicidal   Visit Diagnosis: F33.2 MDD Recurrent Severe without psychosis    CCA Screening, Triage and Referral (STR)  Patient Reported Information How did you hear about us ? No data recorded What Is the Reason for Your Visit/Call Today? Per EDP Note: Evelyn Booker is a 23 y.o. female history of suicide attempts, homicidal ideation, bipolar 2, GAD presented with suicidal ideation for the past few  months.  Patient states that she has not been taking her medications at home which include Depakote , Abilify , Prozac  as she forgets and does not restart them when she forgets.  Patient states that she daydreams by driving off the road and may be getting a gun.  Patient does not have any source or trigger for these thoughts.  Patient has not been seeing her psychiatrist.  Patient is having active SI and states that she is only here because her monitor coming out.     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 She states that she has felt numb for the past year.  She states that she was in a toxic relationship with her boyfriend who she states was her trigger for depression.  She has since broken up with him, but has continued to struggle with the loss of this relationship. Patient states that she was hospitalized in the past and it helped her.  She was following up with a therapist and an outpatient provider for medications, but states that she has a hard time following through with things and states that she stopped going and stopped taking her medications.  Patient states that she has one prior suicide attempt by trying to inhale propane gas from a camp stove. Patient states that she has homicidal thoughts towards her ex-boyfriend, but has no plan or intent.  Patient denies any history of hallucinations.  Currently, she states that she is having a hard time getting  out of bed and states that she is sleeping 12 hours a day.  She states that she tries to eat at least 2 meals per day and denies any changes in her weight. She denies any history of abuse or self-mutilating behaviors. She admits to occasional marijuana use.  Patient states that she currently resides with two roommates, but states that she is not really close to them.  She states  that her mother, grandmother, her identified father and one close friend are supportive.  She states that she is not currently in an intimate relationship.  Patient states that she wants to be successful in life, have a career and to be financially secure so that she can travel.  However, patient states that she is not sure what she really wants to do with her life and her follow-through is poor.  Patient states that she was raised by her mother and her mother's boyfriend that she considers to be her father.  She states that she has never known her biological father. Patient states that she has no children.  She denies any history of any legal issues and denies having access to weapons.  Patient is alert and oriented.  Her mood is depressed and her affect is flat.  Her thoughts are organized and her memory is intact.  She does not appear to be responding to any internal stimuli.  Her judgment, insight and impulse control are impaired.  Her speech is coherent and normal in tone, volume and rate.  Her eye contact is good.   How Long Has This Been Causing You Problems? 1 wk - 1 month  What Do You Feel Would Help You the Most Today? Treatment for Depression or other mood problem   Have You Recently Had Any Thoughts About Hurting Yourself? Yes  Are You Planning to Commit Suicide/Harm Yourself At This time? Yes (not able to contract for safety)   Flowsheet Row ED from 03/17/2023 in [facility] Emergency Department at [facility] Office Visit from [facility]20[facility]20[facility] Admission (Discharged) [facility]  C-SSRS RISK CATEGORY High Risk Error: Q3, 4, or 5 should not be populated when Q2 is No High Risk       Have you Recently Had Thoughts About Hurting Someone Evelyn Booker? Yes  Are You Planning to Harm Someone at This Time? No  Explanation: states that she has ill feelings towards her ex-boyfriend, but  has no plan or intent to kill him   Have You Used Any Alcohol or Drugs in the Past 24 Hours? No  How Long Ago Did You Use Drugs or Alcohol? No data recorded What Did You Use and How Much? No data recorded  Do You Currently Have a Therapist/Psychiatrist? No  Name of Therapist/Psychiatrist:    Have You Been Recently Discharged From Any Office Practice or Programs? No  Explanation of Discharge From Practice/Program: No data recorded    CCA Screening Triage Referral Assessment Type of Contact: Tele-Assessment  Telemedicine Service Delivery:   Is this Initial or Reassessment? Is this Initial or Reassessment?: Initial Assessment  Date Telepsych consult ordered in [facility]:  Date Telepsych consult ordered in [facility]: 03/17/23  Time Telepsych consult ordered in [facility]:  Time Telepsych consult ordered in [facility]: 2317  Location of Assessment: [facility] ED  Provider Location: [facility] Assessment Services   Collateral Involvement: none available at time of assessment   Does Patient Have a Automotive Engineer Guardian?  No (NA)  Legal Guardian Contact Information: NA  Copy of Legal Guardianship Form: -- (NA)  Legal Guardian Notified of Arrival: -- (NA)  Legal Guardian Notified of Pending Discharge: -- (NA)  If Minor and Not Living with Parent(s), Who has Custody? NA  Is CPS involved or ever been involved? Never  Is APS involved or ever been involved? Never   Patient Determined To Be At Risk for Harm To Self or Others Based on Review of Patient Reported Information or Presenting Complaint? Yes, for Self-Harm  Method: Plan with intent and identified person (self)  Availability of Means: In hand or used  Intent: Clearly intends on inflicting harm that could cause death  Notification Required: Identifiable person is aware (self)  Additional Information for Danger to Others Potential: Previous attempts  Additional Comments for Danger to Others Potential: attempted to gas self to death in the  past  Are There Guns or Other Weapons in Your Home? No  Types of Guns/Weapons: NA  Are These Weapons Safely Secured?                            -- (NA)  Who Could Verify You Are Able To Have These Secured: NA  Do You Have any Outstanding Charges, Pending Court Dates, Parole/Probation? none reported  Contacted To Inform of Risk of Harm To Self or Others: Other: Comment (risk to self only)    Does Patient Present under Involuntary Commitment? No    [place] of Residence: [place]   Patient Currently Receiving the Following Services: Not Receiving Services   Determination of Need: Urgent (48 hours)   Options For Referral: Inpatient Hospitalization     CCA Biopsychosocial Patient Reported Schizophrenia/Schizoaffective Diagnosis in Past: No   Strengths: states that she is career focused and wants to be successful   Mental Health Symptoms Depression:  Change in energy/activity; Difficulty Concentrating; Fatigue; Increase/decrease in appetite; Sleep (too much or little); Worthlessness   Duration of Depressive symptoms: Duration of Depressive Symptoms: Greater than two weeks   Mania:  None   Anxiety:   None   Psychosis:  None   Duration of Psychotic symptoms:    Trauma:  None   Obsessions:  None   Compulsions:  None   Inattention:  None   Hyperactivity/Impulsivity:  None   Oppositional/Defiant Behaviors:  None   Emotional Irregularity:  Chronic feelings of emptiness; Recurrent suicidal behaviors/gestures/threats; Unstable self-image   Other Mood/Personality Symptoms:  depressed mood, flat affect    Mental Status Exam Appearance and self-care  Stature:  Average   Weight:  Overweight   Clothing:  Neat/clean; Casual   Grooming:  Well-groomed   Cosmetic use:  Age appropriate   Posture/gait:  Normal   Motor activity:  Not Remarkable   Sensorium  Attention:  Distractible   Concentration:  Anxiety interferes   Orientation:  Object; Person;  Place; Situation; Time; X5   Recall/memory:  Normal   Affect and Mood  Affect:  Flat; Depressed   Mood:  Depressed   Relating  Eye contact:  Normal   Facial expression:  Depressed   Attitude toward examiner:  Cooperative   Thought and Language  Speech flow: Clear and Coherent   Thought content:  Appropriate to Mood and Circumstances   Preoccupation:  Suicide   Hallucinations:  None   Organization:  Goal-directed   Company Secretary of Knowledge:  Good   Intelligence:  Above Average   Abstraction:  Normal   Judgement:  Impaired   Reality Testing:  Realistic   Insight:  Lacking   Decision Making:  Impulsive   Social Functioning  Social Maturity:  Responsible; Isolates   Social Judgement:  Normal   Stress  Stressors:  School; Grief/losses   Coping Ability:  Overwhelmed   Skill Deficits:  None   Supports:  Family     Religion: Religion/Spirituality Are You A Religious Person?:  (not assessed) How Might This Affect Treatment?: NA  Leisure/Recreation: Leisure / Recreation Do You Have Hobbies?: Yes Leisure and Hobbies: Watch tv, read, and self-care  Exercise/Diet: Exercise/Diet Do You Exercise?: No Have You Gained or Lost A Significant Amount of Weight in the Past Six Months?: No Do You Follow a Special Diet?: No Do You Have Any Trouble Sleeping?: Yes Explanation of Sleeping Difficulties: sleeps excessively 12 hrs   CCA Employment/Education Employment/Work Situation: Employment / Work Situation Employment Situation: Surveyor, Minerals Job has Been Impacted by Current Illness: Yes Describe how Patient's Job has Been Impacted: on academic probation Has Patient ever Been in the [employer]?: No  Education: Education Is Patient Currently Attending School?: Yes School Currently Attending: [employer] Last Grade Completed:  (currently in her Sophomore year) Did You Attend College?: Yes What Type of College Degree Do you Have?: NA Did You  Have An Individualized Education Program (IIEP): No Did You Have Any Difficulty At School?: No Patient's Education Has Been Impacted by Current Illness: Yes How Does Current Illness Impact Education?: on academic probation   CCA Family/Childhood History Family and Relationship History: Family history Marital status: Single Does patient have children?: No  Childhood History:  Childhood History By whom was/is the patient raised?: Mother, Other (Comment) (mother's boyfriend, considers him as her father, does not know her biological father) Did patient suffer any verbal/emotional/physical/sexual abuse as a child?: No Did patient suffer from severe childhood neglect?: No Has patient ever been sexually abused/assaulted/raped as an adolescent or adult?: No Was the patient ever a victim of a crime or a disaster?: No Witnessed domestic violence?: No Has patient been affected by domestic violence as an adult?: No       CCA Substance Use Alcohol/Drug Use: Alcohol / Drug Use Pain Medications: see MAR Prescriptions: see MAR Over the Counter: see MAR History of alcohol / drug use?: Yes Longest period of sobriety (when/how long): none reported Negative Consequences of Use: Work / Programmer, Multimedia Withdrawal Symptoms: None Substance #1 Name of Substance 1: marijuana 1 - Age of First Use: not disclosed 1 - Amount (size/oz): not disclosed 1 - Frequency: 1-2 times weekly 1 - Duration: not disclosed 1 - Last Use / Amount: not disclosed 1 - Method of Aquiring: not disclosed 1- Route of Use: smoke                       ASAM's:  Six Dimensions of Multidimensional Assessment  Dimension 1:  Acute Intoxication and/or Withdrawal Potential:   Dimension 1:  Description of individual's past and current experiences of substance use and withdrawal: Patient has no complications with withdrawal symptoms  Dimension 2:  Biomedical Conditions and Complications:   Dimension 2:  Description of patient's  biomedical conditions and  complications: patient has no current medical conditions or complaints  Dimension 3:  Emotional, Behavioral, or Cognitive Conditions and Complications:  Dimension 3:  Description of emotional, behavioral, or cognitive conditions and complications: Patient is currently depressed and suicidal  Dimension 4:  Readiness to Change:  Dimension 4:  Description of Readiness to Change criteria: Patient has little insight of how her drug use affects her mental health  Dimension 5:  Relapse, Continued use, or Continued Problem Potential:  Dimension 5:  Relapse, continued use, or continued problem potential critiera description: Patient lacks coping strategies to prevent her use of marijuana  Dimension 6:  Recovery/Living Environment:  Dimension 6:  Recovery/Iiving environment criteria description: Patient lives in a safe and supportive environment  ASAM Severity Score: ASAM's Severity Rating Score: 9  ASAM Recommended Level of Treatment: ASAM Recommended Level of Treatment: Level II Intensive Outpatient Treatment   Substance use Disorder (SUD) Substance Use Disorder (SUD)  Checklist Symptoms of Substance Use: Continued use despite persistent or recurrent social, interpersonal problems, caused or exacerbated by use, Presence of craving or strong urge to use, Recurrent use that results in a failure to fulfill major role obligations (work, school, home), Social, occupational, recreational activities given up or reduced due to use  Recommendations for Services/Supports/Treatments: Recommendations for Services/Supports/Treatments Recommendations For Services/Supports/Treatments: CD-IOP Intensive Chemical Dependency Program  Disposition Recommendation per psychiatric provider: We recommend inpatient psychiatric hospitalization when medically cleared. Patient is under voluntary admission status at this time; please IVC if attempts to leave hospital.   DSM5 Diagnoses: Patient Active  Problem List   Diagnosis Date Noted   Bipolar 2 disorder, major depressive episode (HCC) 06/03/2022   Insomnia 06/03/2022   Difficulty controlling anger 06/03/2022   Suicide attempt (HCC) 06/02/2022   GAD (generalized anxiety disorder) 06/02/2022   Homicidal ideation 06/02/2022   Severe episode of recurrent major depressive disorder, without psychotic features (HCC) 06/02/2022   Hypermobility syndrome 10/11/2018   Dislocation of right patella 10/11/2018     Referrals to Alternative Service(s): Referred to Alternative Service(s):   Place:   Date:   Time:    Referred to Alternative Service(s):   Place:   Date:   Time:    Referred to Alternative Service(s):   Place:   Date:   Time:    Referred to Alternative Service(s):   Place:   Date:   Time:     Makaylia Hewett J Zayvien Canning, LCAS

## 2023-03-18 NOTE — Progress Notes (Signed)
 Patient is a 23 year old female who presented to [facility] from [facility] under IVC due to worsening suicidal ideation for the past few months. Pt has a hx of bipolar 2, GAD, and prior suicide attempts. Pt reported that she has not been consistent with taking her meds, explaining that they made her feel numb. Pt is currently a sophomore at [employer] ,reporting that she has been struggling with school, and is currently on academic probation.Pt currently lives off campus with two room mates, and works part time at [employer]. Pt reported that she rarely drinks alcohol, but uses weed 'daily'. Pt denies other illicit drug use. Pt currently denies active SI/HI, paranoia, and A/VH and does not appear to be responding to internal stimuli. Pt presented with an anxious affect, calm, cooperative behavior, answered questions logically and coherently during admission interview and assessment. Admission paperwork completed and signed. VS monitored and recorded. Skin check performed with MHT Belongings searched and secured in locker. Patient was oriented to unit and schedule. PO fluids provided. Q 15 min checks initiated for safety.

## 2023-03-18 NOTE — BH Assessment (Addendum)
 Disposition Note:  The patient was accepted by the night shift provider to [facility]. The current [facility] AC, Linsey, RN, has indicated that coordination has already been arranged by the night shift [facility] AC.

## 2023-03-18 NOTE — Plan of Care (Signed)
   Problem: Education: Goal: Knowledge of Veteran General Education information/materials will improve Outcome: Progressing Goal: Emotional status will improve Outcome: Progressing Goal: Verbalization of understanding the information provided will improve Outcome: Progressing

## 2023-03-18 NOTE — Tx Team (Signed)
 Initial Treatment Plan 03/18/2023 4:11 PM [name] [id number]    PATIENT STRESSORS: Educational concerns   Loss of relationship   Other: unable to follow through     PATIENT STRENGTHS: Ability for insight  Average or above average intelligence  Capable of independent living  Communication skills  Motivation for treatment/growth  Physical Health  Supportive family/friends  Work skills    PATIENT IDENTIFIED PROBLEMS: Suicidal ideation    Unable to follow through with anything     Hopeless     Loneliness    Academic probation     DISCHARGE CRITERIA:  Improved stabilization in mood, thinking, and/or behavior Reduction of life-threatening or endangering symptoms to within safe limits Verbal commitment to aftercare and medication compliance  PRELIMINARY DISCHARGE PLAN: Outpatient therapy Return to previous living arrangement Return to previous work or school arrangements  PATIENT/FAMILY INVOLVEMENT: This treatment plan has been presented to and reviewed with the patient, Evelyn Booker,  The patient has been given the opportunity to ask questions and make suggestions.  Berwyn GORMAN Acosta, RN 03/18/2023, 4:11 PM

## 2023-03-18 NOTE — [facility] Group Notes (Signed)
 [facility] Group Notes:  (Nursing/MHT/Case Management/Adjunct)  Date:  03/18/2023  Time:  2000  Type of Therapy:   Wrap up group  Participation Level:  Active  Participation Quality:  Appropriate, Attentive, Sharing, and Supportive  Affect:  Appropriate  Cognitive:  Alert  Insight:  Improving  Engagement in Group:  Engaged  Modes of Intervention:  Clarification, Education, and Support  Summary of Progress/Problems: Positive thinking and positive change were discussed.   Lenora Shaver S 03/18/2023, 9:01 PM

## 2023-03-19 ENCOUNTER — Encounter (HOSPITAL_COMMUNITY): Payer: Self-pay

## 2023-03-19 DIAGNOSIS — F333 Major depressive disorder, recurrent, severe with psychotic symptoms: Secondary | ICD-10-CM | POA: Diagnosis not present

## 2023-03-19 LAB — VITAMIN B12: Vitamin B-12: 375 pg/mL (ref 180–914)

## 2023-03-19 LAB — FOLATE: Folate: 21.3 ng/mL (ref >5.9)

## 2023-03-19 MED ORDER — NICOTINE 21 MG/24HR TD PT24
21.0000 mg | MEDICATED_PATCH | Freq: Every day | TRANSDERMAL | Status: DC
  Administered 2023-03-19 – 2023-03-23 (×5): 21 mg via TRANSDERMAL
  Filled 2023-03-19 (×9): qty 1

## 2023-03-19 MED ORDER — FLUOXETINE HCL 20 MG PO CAPS
20.0000 mg | ORAL_CAPSULE | Freq: Every day | ORAL | Status: DC
  Administered 2023-03-20 – 2023-03-23 (×4): 20 mg via ORAL
  Filled 2023-03-19 (×7): qty 1

## 2023-03-19 MED ORDER — ARIPIPRAZOLE 5 MG PO TABS
5.0000 mg | ORAL_TABLET | Freq: Every day | ORAL | Status: DC
Start: 2023-03-20 — End: 2023-03-21
  Administered 2023-03-20 – 2023-03-21 (×2): 5 mg via ORAL
  Filled 2023-03-19 (×4): qty 1

## 2023-03-19 NOTE — Plan of Care (Signed)
  Problem: Activity: Goal: Interest or engagement in activities will improve Outcome: Progressing Goal: Sleeping patterns will improve Outcome: Progressing   Problem: Coping: Goal: Ability to verbalize frustrations and anger appropriately will improve Outcome: Progressing Goal: Ability to demonstrate self-control will improve Outcome: Progressing   Problem: Health Behavior/Discharge Planning: Goal: Identification of resources available to assist in meeting health care needs will improve Outcome: Progressing

## 2023-03-19 NOTE — Plan of Care (Signed)
  Problem: Coping: Goal: Ability to verbalize frustrations and anger appropriately will improve Outcome: Progressing Goal: Ability to demonstrate self-control will improve Outcome: Progressing   Problem: Health Behavior/Discharge Planning: Goal: Identification of resources available to assist in meeting health care needs will improve Outcome: Progressing Goal: Compliance with treatment plan for underlying cause of condition will improve Outcome: Progressing

## 2023-03-19 NOTE — Group Note (Signed)
 Date:  03/19/2023 Time:  5:04 PM  Group Topic/Focus:  Dimensions of Wellness:   The focus of this group is to introduce the topic of wellness and discuss the role each dimension of wellness plays in total health.    Participation Level:  Active  Participation Quality:  Appropriate  Affect:  Appropriate  Cognitive:  Appropriate  Insight: Appropriate  Engagement in Group:  Engaged  Modes of Intervention:  Activity and Socialization  Additional Comments:   Pt attended and actively participated in the Social Wellness group. Pt practiced active listening, problem solving and teamwork to complete the social wellness activity.  Evelyn Booker 03/19/2023, 5:04 PM

## 2023-03-19 NOTE — Progress Notes (Signed)
   03/19/23 0900  Psych Admission Type (Psych Patients Only)  Admission Status Voluntary  Psychosocial Assessment  Patient Complaints Anxiety  Eye Contact Fair  Facial Expression Flat  Affect Anxious  Speech Logical/coherent  Interaction Guarded  Motor Activity Slow  Appearance/Hygiene Unremarkable  Behavior Characteristics Cooperative;Appropriate to situation  Mood Anxious  Thought Process  Coherency WDL  Content WDL  Delusions None reported or observed  Perception WDL  Hallucination None reported or observed  Judgment Limited  Confusion None  Danger to Self  Current suicidal ideation? Denies  Agreement Not to Harm Self Yes  Description of Agreement verbal  Danger to Others  Danger to Others None reported or observed

## 2023-03-19 NOTE — Group Note (Signed)
 Recreation Therapy Group Note   Group Topic:Problem Solving  Group Date: 03/19/2023 Start Time: 0934 End Time: 0956 Facilitators: Ji Fairburn-McCall, LRT,CTRS Location: [address] Dayroom   Group Topic: Communication, Team Building, Problem Solving  Goal Area(s) Addresses:  Patient will effectively work with peer towards shared goal.  Patient will identify skills used to make activity successful.  Patient will identify how skills used during activity can be used to reach post d/c goals.   Intervention: STEM Activity  Activity: Stage Manager. In teams of 3-5, patients were given 12 plastic drinking straws and an equal length of masking tape. Using the materials provided, patients were asked to build a landing pad to catch a golf ball dropped from approximately 5 feet in the air. All materials were required to be used by the team in their design. LRT facilitated post-activity discussion.  Education: Pharmacist, Community, Scientist, Physiological, Discharge Planning   Education Outcome: Acknowledges education/In group clarification offered/Needs additional education.    Affect/Mood: Appropriate   Participation Level: Engaged   Participation Quality: Independent   Behavior: Appropriate   Speech/Thought Process: Focused   Insight: Good   Judgement: Good   Modes of Intervention: Problem-solving   Patient Response to Interventions:  Engaged   Education Outcome:  In group clarification offered    Clinical Observations/Individualized Feedback: Pt attended and participated in group session. Pt took on leadership role of her group. Pt worked well with peers in completing activity.      Plan: Continue to engage patient in RT group sessions 2-3x/week.   Haidyn Chadderdon-McCall, LRT,CTRS 03/19/2023 11:55 AM

## 2023-03-19 NOTE — Group Note (Signed)
 Date:  03/19/2023 Time:  12:55 PM  Group Topic/Focus:  Goals Group:   The focus of this group is to help patients establish daily goals to achieve during treatment and discuss how the patient can incorporate goal setting into their daily lives to aide in recovery. Orientation:   The focus of this group is to educate the patient on the purpose and policies of crisis stabilization and provide a format to answer questions about their admission.  The group details unit policies and expectations of patients while admitted.    Participation Level:  Minimal  Participation Quality:  Appropriate  Affect:  Appropriate  Cognitive:  Appropriate  Insight: Appropriate  Engagement in Group:  Engaged  Modes of Intervention:  Discussion, Orientation, and Rapport Building  Additional Comments:   Pt attended the Orientation and Goals group. Pt was quiet yet attentive during group. Pt personal goal for today is to attend scheduled groups.  [name] 03/19/2023, 12:55 PM

## 2023-03-19 NOTE — Group Note (Signed)
 Date:  03/19/2023 Time:  9:18 PM  Group Topic/Focus:  AA Meeting    Participation Level:  Did Not Attend  Participation Quality:   n/a  Affect:   n/a  Cognitive:   n/a  Insight: None  Engagement in Group:   n/a  Modes of Intervention:   n/a  Additional Comments:  Patient did not attend AA.   Eward Mace 03/19/2023, 9:18 PM

## 2023-03-19 NOTE — H&P (Signed)
 Psychiatric Admission Assessment Adult  Patient Identification: Evelyn Booker  MRN:  [id number]  Date of Evaluation:  03/19/2023  Chief Complaint: Worsening suicidal ideations & non-compliant to her medication regimen.  Principal Diagnosis: MDD (major depressive disorder), recurrent, severe, with psychosis (HCC)  Diagnosis:  Principal Problem:   MDD (major depressive disorder), recurrent, severe, with psychosis (HCC)  History of Present Illness: This is the second psychiatric admission in this [facility] for this 23 year old AA female with hx of bipolar-2 disorder & generalized anxiety disorder. Evelyn Booker was a patient in this [facility] in April last year, 2024 with similar complaints (worsening depression triggering suicidal ideations with plans). She was treated at the time, stabilized & discharged with an outpatient psychiatric recommendations for routine psychiatric care & medication management. Patient is being re-admitted to the [facility] this time around with complaint of worsening suicidal ideations with plans to hurt herself. During this evaluation, patient reports,   My mom dropped me off at the [facility] long hospital ED 2 days ago. I was having suicidal ideations with an ongoing pressures from school. I'm on an academic probation. I was suppose to come out of the probation after I I raise my GPA this semester. I realized now that I will not be getting off this probation because, I have not brought-up my GPA to at least 2.00. I do no longer have any motivation to do the work because I have ADHD. I'm also not consistent with taking my mental medications as were recommended. I have not taken any of my medications for mental health in two months. Now, my depression is worse as a result. While I'm here, I need to try to figure out the medicines that will help me the most. I have been feeling norm, but right now, I'm feeling a lot of sadness.  Associated Signs/Symptoms:  Depression Symptoms:  depressed  mood, anhedonia, hopelessness, anxiety,  (Hypo) Manic Symptoms:  Irritable Mood, Labiality of Mood,  Anxiety Symptoms:  Excessive Worry,  Psychotic Symptoms:   Patient currently denies any AVH, delusional thoughts or paranoia. She does not appear to be responding to any internal stimuli.  PTSD Symptoms: NA  Total Time spent with patient: 1.5 hours  Past Psychiatric History: Previous Psych Diagnoses: MDD, GAD & ADHD Prior inpatient treatment: [facility], April 2024.    Is the patient at risk to self? No.  Has the patient been a risk to self in the past 6 months? Yes.    Has the patient been a risk to self within the distant past? Yes.    Is the patient a risk to others? Yes.    Has the patient been a risk to others in the past 6 months? Yes.    Has the patient been a risk to others within the distant past? No.   Columbia Scale:  Flowsheet Row [facility]) [facility]2023 in [facility]  C-SSRS RISK CATEGORY High Risk High Risk Error: Q3, 4, or 5 should not be populated when Q2 is No      Prior Inpatient Therapy: Yes.   If yes, describe: [facility]   Prior Outpatient Therapy: Yes.   If yes, describe:   Alcohol Screening: 1. How often do you have a drink containing alcohol?: Monthly or less 2. How many drinks containing alcohol do you have on a typical day when you are  drinking?: 1 or 2 3. How often do you have six or more drinks on one occasion?: Never AUDIT-C Score: 1 4. How often during the last year have you found that you were not able to stop drinking once you had started?: Never 5. How often during the last year have you failed to do what was normally expected from you because of drinking?: Never 6. How often during the last year have you needed a first drink in the morning to get  yourself going after a heavy drinking session?: Never 7. How often during the last year have you had a feeling of guilt of remorse after drinking?: Never 8. How often during the last year have you been unable to remember what happened the night before because you had been drinking?: Never 9. Have you or someone else been injured as a result of your drinking?: No 10. Has a relative or friend or a doctor or another health worker been concerned about your drinking or suggested you cut down?: No Alcohol Use Disorder Identification Test Final Score (AUDIT): 1  Substance Abuse History in the last 12 months:  Yes.    Consequences of Substance Abuse: Discussed with patient during this admission evaluation. Medical Consequences:  Liver damage, Possible death by overdose Legal Consequences:  Arrests, jail time, Loss of driving privilege. Family Consequences:  Family discord, divorce and or separation.  Previous Psychotropic Medications: Yes   Psychological Evaluations: Yes   Past Medical History:  Past Medical History:  Diagnosis Date   ADHD    Anxiety    Depression     Past Surgical History:  Procedure Laterality Date   BREAST BIOPSY     Family History: History reviewed. No pertinent family history.  Family Psychiatric  History: Patient reports that substance addictions run in her family.  Tobacco Screening:  Social History   Tobacco Use  Smoking Status Never  Smokeless Tobacco Never    BH Tobacco Counseling     Are you interested in Tobacco Cessation Medications?  N/A, patient does not use tobacco products Counseled patient on smoking cessation:  N/A, patient does not use tobacco products Reason Tobacco Screening Not Completed: No value filed.       Social History: : Patient is single, has no children, a sophomore in college, currently on an academic probation. Reports that she was born and raised in [place], [place], moved to [place], then [facility] [place] , then to [place] ,  then to [place], [place]. 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 Reports that she currently lives with her mother and step-father here in [place]. She reports that she has 3 half siblings and 1 step sister. States family is supportive.  Social History   Substance and Sexual Activity  Alcohol Use Not Currently   Comment: rarely     Social History   Substance and Sexual Activity  Drug Use Yes   Types: Marijuana    Additional Social History:  Allergies:  No Known Allergies Lab Results:  Results for orders placed or performed during the hospital encounter of 03/17/23 (from the past 48 hours)  Rapid urine drug screen (hospital performed)     Status: Abnormal   Collection Time: 03/17/23  9:57 PM  Result Value Ref Range   Opiates NONE DETECTED NONE DETECTED   Cocaine NONE DETECTED NONE DETECTED   Benzodiazepines NONE DETECTED NONE DETECTED   Amphetamines NONE DETECTED NONE DETECTED   Tetrahydrocannabinol  POSITIVE (A) NONE DETECTED   Barbiturates NONE DETECTED NONE DETECTED    Comment: (NOTE) DRUG SCREEN FOR MEDICAL PURPOSES ONLY.  IF CONFIRMATION IS NEEDED FOR ANY PURPOSE, NOTIFY LAB WITHIN 5 DAYS.  LOWEST DETECTABLE LIMITS FOR URINE DRUG SCREEN Drug Class                     Cutoff (ng/mL) Amphetamine and metabolites    1000 Barbiturate and metabolites    200 Benzodiazepine                 200 Opiates and metabolites        300 Cocaine and metabolites        300 THC                            50 Performed at [facility], [address], [place], [place] [address]   Comprehensive metabolic panel     Status: None   Collection Time: 03/17/23 10:06 PM  Result Value Ref Range   Sodium 137 135 - 145 mmol/L   Potassium 3.6 3.5 - 5.1 mmol/L   Chloride 106 98 - 111 mmol/L   CO2 22 22 - 32 mmol/L   Glucose, Bld 87 70 - 99 mg/dL    Comment: Glucose reference range applies only to samples taken  after fasting for at least 8 hours.   BUN 11 6 - 20 mg/dL   Creatinine, Ser 9.24 0.44 - 1.00 mg/dL   Calcium 9.0 8.9 - 89.6 mg/dL   Total Protein 7.6 6.5 - 8.1 g/dL   Albumin 3.9 3.5 - 5.0 g/dL   AST 19 15 - 41 U/L   ALT 14 0 - 44 U/L   Alkaline Phosphatase 59 38 - 126 U/L   Total Bilirubin 0.5 0.0 - 1.2 mg/dL   GFR, Estimated >39 >39 mL/min    Comment: (NOTE) Calculated using the CKD-EPI Creatinine Equation (2021)    Anion gap 9 5 - 15    Comment: Performed at [facility], [address] [place], [place] [address]  Ethanol     Status: None   Collection Time: 03/17/23 10:06 PM  Result Value Ref Range   Alcohol, Ethyl (B) <10 <10 mg/dL    Comment: (NOTE) Lowest detectable limit for serum alcohol is 10 mg/dL.  For medical purposes only. Performed at [facility], [address], [place], [place] [address]   Salicylate level     Status: Abnormal   Collection Time: 03/17/23 10:06 PM  Result Value Ref Range   Salicylate Lvl <7.0 (L) 7.0 - 30.0 mg/dL    Comment: Performed at [facility], [address] [place], [place] [address]  Acetaminophen  level     Status: Abnormal   Collection Time: 03/17/23 10:06 PM  Result Value Ref Range   Acetaminophen  (Tylenol ), Serum <10 (L) 10 - 30 ug/mL    Comment: (NOTE) Therapeutic concentrations vary significantly. A range of 10-30 ug/mL  may be an effective concentration for many patients. However, some  are best treated at concentrations outside of this range. Acetaminophen  concentrations >150 ug/mL at 4 hours after ingestion  and >50 ug/mL at 12 hours after ingestion are often associated with  toxic reactions.  Performed at [facility], [address], [place], [place] [address]   cbc     Status: None   Collection Time: 03/17/23 10:06 PM  Result Value Ref Range   WBC 6.1 4.0 - 10.5 K/uL   RBC 4.55 3.87 - 5.11 MIL/uL   Hemoglobin 12.2 12.0 - 15.0 g/dL    HCT 60.3 63.9 - 53.9 %   MCV 87.0 80.0 - 100.0 fL   MCH 26.8 26.0 - 34.0 pg   MCHC 30.8 30.0 - 36.0 g/dL   RDW 87.1 88.4 - 84.4 %   Platelets 241 150 - 400 K/uL   nRBC 0.0 0.0 - 0.2 %    Comment: Performed at [facility], [address] [place], [place] [address]  hCG, serum, qualitative     Status: None   Collection Time: 03/17/23 10:06 PM  Result Value Ref Range   Preg, Serum NEGATIVE NEGATIVE    Comment:        THE SENSITIVITY OF THIS METHODOLOGY IS >10 mIU/mL. Performed at [facility], [address] [place], [place] [address]    Blood Alcohol level:  Lab Results  Component Value Date   [facility] <10 03/17/2023   ETH <10 06/02/2022   Metabolic Disorder Labs:  Lab Results  Component Value Date   HGBA1C 5.2 06/04/2022   MPG 102.54 06/04/2022   No results found for: PROLACTIN Lab Results  Component Value Date   CHOL 174 06/04/2022   TRIG 83 06/04/2022   HDL 53 06/04/2022   CHOLHDL 3.3 06/04/2022   VLDL 17 06/04/2022   LDLCALC 104 (H) 06/04/2022   Current Medications: Current Facility-Administered Medications  Medication Dose Route Frequency Provider Last Rate Last Admin   acetaminophen  (TYLENOL ) tablet 650 mg  650 mg Oral Q6H PRN Onuoha, Chinwendu V, NP       alum & mag hydroxide-simeth (MAALOX/MYLANTA) 200-200-20 MG/5ML suspension 30 mL  30 mL Oral Q4H PRN Onuoha, Chinwendu V, NP       [START ON 03/20/2023] ARIPiprazole  (ABILIFY ) tablet 5 mg  5 mg Oral Daily Rilee Knoll I, NP       haloperidol  (HALDOL ) tablet 5 mg  5 mg Oral TID PRN Onuoha, Chinwendu V, NP       And   diphenhydrAMINE  (BENADRYL ) capsule 50 mg  50 mg Oral TID PRN Onuoha, Chinwendu V, NP       haloperidol  lactate (HALDOL ) injection 5 mg  5 mg Intramuscular TID PRN Onuoha, Chinwendu V, NP       And   diphenhydrAMINE  (BENADRYL ) injection 50 mg  50 mg Intramuscular TID PRN Onuoha, Chinwendu V, NP       And   LORazepam  (ATIVAN ) injection 2 mg  2 mg Intramuscular TID  PRN Onuoha, Chinwendu V, NP       haloperidol  lactate (HALDOL ) injection 10 mg  10 mg Intramuscular TID PRN Onuoha, Chinwendu V, NP       And   diphenhydrAMINE  (BENADRYL ) injection 50 mg  50 mg Intramuscular TID PRN Onuoha, Chinwendu V, NP       And   LORazepam  (ATIVAN ) injection 2 mg  2 mg Intramuscular TID PRN Onuoha, Chinwendu V, NP       [START ON 03/20/2023] FLUoxetine  (PROZAC ) capsule 20 mg  20 mg Oral Daily Equilla Que I, NP       hydrOXYzine  (ATARAX ) tablet 25 mg  25 mg Oral TID PRN Onuoha, Chinwendu V, NP   25 mg at 03/18/23 2108   magnesium  hydroxide (MILK OF MAGNESIA) suspension 30 mL  30 mL Oral Daily PRN Onuoha, Chinwendu V, NP       nicotine  (NICODERM CQ  -  dosed in mg/24 hours) patch 21 mg  21 mg Transdermal Daily Jaylenne Hamelin I, NP       traZODone  (DESYREL ) tablet 50 mg  50 mg Oral QHS PRN Onuoha, Chinwendu V, NP   50 mg at 03/18/23 2108   PTA Medications: Medications Prior to Admission  Medication Sig Dispense Refill Last Dose/Taking   ARIPiprazole  (ABILIFY ) 5 MG tablet Take 1 tablet (5 mg total) by mouth daily. 30 tablet 2 03/19/2023   FLUoxetine  (PROZAC ) 20 MG capsule Take 1 capsule (20 mg total) by mouth daily. 30 capsule 2 03/19/2023   divalproex  (DEPAKOTE ) 500 MG DR tablet Take 500 mg by mouth at bedtime. (Patient not taking: Reported on 03/17/2023)      Musculoskeletal: Strength & Muscle Tone: within normal limits Gait & Station: normal Patient leans: N/A  Psychiatric Specialty Exam:  Presentation  General Appearance:  Casual; Fairly Groomed  Eye Contact: Good  Speech: Clear and Coherent; Normal Rate  Speech Volume: Normal  Handedness: Right   Mood and Affect  Mood: Hopeless; Depressed  Affect: Congruent   Thought Process  Thought Processes: Coherent; Goal Directed; Linear  Duration of Psychotic Symptoms:N/A  Past Diagnosis of Schizophrenia or Psychoactive disorder: No  Descriptions of Associations:Intact  Orientation:Full (Time, Place and  Person)  Thought Content:Logical  Hallucinations:Hallucinations: None  Ideas of Reference:None  Suicidal Thoughts:Suicidal Thoughts: No  Homicidal Thoughts:Homicidal Thoughts: No   Sensorium  Memory: Immediate Good; Recent Good; Remote Good  Judgment: Poor  Insight: Poor   Executive Functions  Concentration: Fair  Attention Span: Fair  Recall: Fair  Fund of Knowledge: Fair  Language: Good   Psychomotor Activity  Psychomotor Activity:Psychomotor Activity: Normal   Assets  Assets: Communication Skills; Desire for Improvement; Housing; Physical Health; Resilience; Social Support   Sleep  Sleep:Sleep: Good Number of Hours of Sleep: 7.5    Physical Exam: Physical Exam Vitals and nursing note reviewed.  Cardiovascular:     Rate and Rhythm: Normal rate.     Pulses: Normal pulses.  Pulmonary:     Effort: Pulmonary effort is normal.  Genitourinary:    Comments: Deferred Musculoskeletal:        General: Normal range of motion.     Cervical back: Normal range of motion.  Skin:    General: Skin is warm and dry.  Neurological:     General: No focal deficit present.     Mental Status: She is alert and oriented to person, place, and time.    Review of Systems  Constitutional:  Negative for chills, diaphoresis and fever.  HENT:  Negative for congestion and sore throat.   Respiratory:  Negative for cough, shortness of breath and wheezing.   Cardiovascular:  Negative for chest pain and palpitations.  Gastrointestinal:  Negative for abdominal pain, constipation, diarrhea, heartburn, nausea and vomiting.  Musculoskeletal:  Negative for joint pain and myalgias.  Neurological:  Negative for dizziness, tingling, tremors, sensory change, speech change, focal weakness, seizures, loss of consciousness, weakness and headaches.  Endo/Heme/Allergies:        NKDA  Psychiatric/Behavioral:  Positive for depression and substance abuse (UDS (+) for THC). Negative  for hallucinations and memory loss. The patient is nervous/anxious and has insomnia.    Blood pressure (!) 103/56, pulse 87, temperature 98.4 F (36.9 C), temperature source Oral, resp. rate 12, height 5' 8 (1.727 m), weight 106.6 kg, SpO2 100%. Body mass index is 35.73 kg/m.  Treatment Plan Summary: Daily contact with patient to assess and evaluate symptoms and  progress in treatment and Medication management.   Principal/active diagnoses.  Plan: The risks/benefits/side-effects/alternatives to the medications in use were discussed in detail with the patient and time was given for patient's questions. The patient consents to medication trial.   Resumed home medications: -Abilify  5 mg po daily for mood control.  -Fluoxetine  20 mg po daily for depression.  -Continue Hydroxyzine  25 mg po tid prn for anxiety. -Continue Trazodone  50 mg po Q hs prn for insomnia.  -Continue Nicoderm patch 21 mg po prn for anxiety.   Agitation protocols.  --Haldol  5 mg, oral, 3 times daily as needed, mild agitation --Benadryl  50 mg, oral, 3 times daily as needed, mild agitation                                     OR --Haldol  injection 5 mg, IM, 3 times daily as needed, moderate agitation --Benadryl  injection 50 mg, IM, 3 times daily as needed, moderate agitation --Ativan  injection 2 mg, IM, 3 times daily as needed, moderate agitation                                     OR --Haldol  injection 10 mg, IM, 3 times daily as needed, severe agitation --Benadryl  injection 50 mg, IM, 3 times daily as needed, severe agitation --Ativan  injection 2 mg, IM, 3 times daily as needed, severe agitation      Other PRNS -Continue Tylenol  650 mg every 6 hours PRN for mild pain -Continue Maalox 30 ml Q 4 hrs PRN for indigestion -Continue MOM 30 ml po Q 6 hrs for constipation  Safety and Monitoring: Voluntary admission to inpatient psychiatric unit for safety, stabilization and treatment Daily contact with patient to  assess and evaluate symptoms and progress in treatment Patient's case to be discussed in multi-disciplinary team meeting Observation Level : q15 minute checks Vital signs: q12 hours Precautions: Safety  Discharge Planning: Social work and case management to assist with discharge planning and identification of hospital follow-up needs prior to discharge Estimated LOS: 5-7 days Discharge Concerns: Need to establish a safety plan; Medication compliance and effectiveness Discharge Goals: Return home with outpatient referrals for mental health follow-up including medication management/psychotherapy  Observation Level/Precautions:  15 minute checks  Laboratory:   Per ED, current lab results reviewed, stable.  Psychotherapy:  Enrolled in the group sessions.  Medications: See MAR.   Consultations: As needed.  Discharge Concerns: Safety, mood stability.  Estimated LOS: 3-5 days.  Other: NA   Physician Treatment Plan for Primary Diagnosis: MDD (major depressive disorder), recurrent, severe, with psychosis (HCC)  Long Term Goal(s): Improvement in symptoms so as ready for discharge  Short Term Goals: Ability to identify changes in lifestyle to reduce recurrence of condition will improve, Ability to verbalize feelings will improve, Ability to disclose and discuss suicidal ideas, and Ability to demonstrate self-control will improve  Physician Treatment Plan for Secondary Diagnosis: Principal Problem:   MDD (major depressive disorder), recurrent, severe, with psychosis (HCC)  Long Term Goal(s): Improvement in symptoms so as ready for discharge  Short Term Goals: Ability to identify and develop effective coping behaviors will improve, Ability to maintain clinical measurements within normal limits will improve, Compliance with prescribed medications will improve, and Ability to identify triggers associated with substance abuse/mental health issues will improve  I certify that inpatient services  furnished can reasonably be expected to improve the patient's condition.    Mac Bolster, NP, pmhnp, fnp-bc 2/7/20251:23 PM

## 2023-03-19 NOTE — BHH Suicide Risk Assessment (Signed)
 Suicide Risk Assessment  Admission Assessment    [facility] Admission Suicide Risk Assessment   Nursing information obtained from:  Patient  Demographic factors:  Adolescent or young adult  Current Mental Status:  NA  Loss Factors:  Loss of significant relationship, Decrease in vocational status  Historical Factors:  Prior suicide attempts, Impulsivity  Risk Reduction Factors:  Positive social support, Employed, Positive therapeutic relationship, Sense of responsibility to family, Living with another person, especially a relative  Total Time spent with patient: 1.5 hours  Principal Problem: MDD (major depressive disorder), recurrent, severe, with psychosis (HCC)  Diagnosis:  Principal Problem:   MDD (major depressive disorder), recurrent, severe, with psychosis (HCC)  Subjective Data: See H&P  Continued Clinical Symptoms:  Alcohol Use Disorder Identification Test Final Score (AUDIT): 1 The Alcohol Use Disorders Identification Test, Guidelines for Use in Primary Care, Second Edition.  World Science Writer [facility]). Score between 0-7:  no or low risk or alcohol related problems. Score between 8-15:  moderate risk of alcohol related problems. Score between 16-19:  high risk of alcohol related problems. Score 20 or above:  warrants further diagnostic evaluation for alcohol dependence and treatment.  CLINICAL FACTORS:   Bipolar Disorder:   Mixed State Depression:   Comorbid alcohol abuse/dependence Impulsivity Alcohol/Substance Abuse/Dependencies More than one psychiatric diagnosis Unstable or Poor Therapeutic Relationship Previous Psychiatric Diagnoses and Treatments  Musculoskeletal: Strength & Muscle Tone: within normal limits Gait & Station: normal Patient leans: N/A  Psychiatric Specialty Exam:  Presentation  General Appearance:  Appropriate for Environment; Well Groomed; Fairly Groomed  Eye Contact: Good  Speech: Clear and Coherent  Speech  Volume: Normal  Handedness: Right   Mood and Affect  Mood: Euthymic  Affect: Congruent   Thought Process  Thought Processes: Coherent  Descriptions of Associations:Intact  Orientation:Full (Time, Place and Person)  Thought Content:Logical  History of Schizophrenia/Schizoaffective disorder:No  Duration of Psychotic Symptoms:No data recorded Hallucinations:No data recorded Ideas of Reference:None  Suicidal Thoughts:No data recorded Homicidal Thoughts:No data recorded  Sensorium  Memory: Immediate Good  Judgment: Good  Insight: Good   Executive Functions  Concentration: Good  Attention Span: Good  Recall: Good  Fund of Knowledge: Good  Language: Good   Psychomotor Activity  Psychomotor Activity:No data recorded  Assets  Assets: Communication Skills   Sleep  Sleep:No data recorded   Physical Exam: Physical Exam Vitals and nursing note reviewed.  Cardiovascular:     Rate and Rhythm: Normal rate.     Pulses: Normal pulses.  Pulmonary:     Effort: Pulmonary effort is normal.  Genitourinary:    Comments: Deferred Musculoskeletal:        General: Normal range of motion.     Cervical back: Normal range of motion.  Skin:    General: Skin is warm and dry.  Neurological:     General: No focal deficit present.     Mental Status: She is alert and oriented to person, place, and time.    Review of Systems  Constitutional:  Negative for chills, diaphoresis and fever.  HENT:  Negative for congestion and sore throat.   Respiratory:  Negative for cough, shortness of breath and wheezing.   Cardiovascular:  Negative for chest pain and palpitations.  Gastrointestinal:  Negative for abdominal pain, constipation, diarrhea, heartburn, nausea and vomiting.  Musculoskeletal:  Negative for joint pain and myalgias.  Neurological:  Negative for dizziness, tingling, tremors, sensory change, speech change, focal weakness, seizures, loss of  consciousness, weakness and headaches.  Endo/Heme/Allergies:  NKDA  Psychiatric/Behavioral:  Positive for depression and substance abuse (UDS (+) for THC). Negative for memory loss and suicidal ideas (Hx of). The patient is nervous/anxious and has insomnia.    Blood pressure (!) 103/56, pulse 87, temperature 98.4 F (36.9 C), temperature source Oral, resp. rate 12, height 5' 8 (1.727 m), weight 106.6 kg, SpO2 100%. Body mass index is 35.73 kg/m.  COGNITIVE FEATURES THAT CONTRIBUTE TO RISK:  Closed-mindedness, Polarized thinking, and Thought constriction (tunnel vision)    SUICIDE RISK:   Severe:  Frequent, intense, and enduring suicidal ideation, specific plan, no subjective intent, but some objective markers of intent (i.e., choice of lethal method), the method is accessible, some limited preparatory behavior, evidence of impaired self-control, severe dysphoria/symptomatology, multiple risk factors present, and few if any protective factors, particularly a lack of social support.  PLAN OF CARE: See H&P  I certify that inpatient services furnished can reasonably be expected to improve the patient's condition.   Mac Bolster, NP, pmhnp, fnp-bc 03/19/2023, 9:30 AM

## 2023-03-19 NOTE — BH IP Treatment Plan (Signed)
 Interdisciplinary Treatment and Diagnostic Plan Update  03/19/2023 Time of Session: 1055am Fawnda Vitullo MRN: [id number]  Principal Diagnosis: MDD (major depressive disorder), recurrent, severe, with psychosis (HCC)  Secondary Diagnoses: Principal Problem:   MDD (major depressive disorder), recurrent, severe, with psychosis (HCC)   Current Medications:  Current Facility-Administered Medications  Medication Dose Route Frequency Provider Last Rate Last Admin   acetaminophen  (TYLENOL ) tablet 650 mg  650 mg Oral Q6H PRN Onuoha, Chinwendu V, NP       alum & mag hydroxide-simeth (MAALOX/MYLANTA) 200-200-20 MG/5ML suspension 30 mL  30 mL Oral Q4H PRN Onuoha, Chinwendu V, NP       haloperidol  (HALDOL ) tablet 5 mg  5 mg Oral TID PRN Onuoha, Chinwendu V, NP       And   diphenhydrAMINE  (BENADRYL ) capsule 50 mg  50 mg Oral TID PRN Onuoha, Chinwendu V, NP       haloperidol  lactate (HALDOL ) injection 5 mg  5 mg Intramuscular TID PRN Onuoha, Chinwendu V, NP       And   diphenhydrAMINE  (BENADRYL ) injection 50 mg  50 mg Intramuscular TID PRN Onuoha, Chinwendu V, NP       And   LORazepam  (ATIVAN ) injection 2 mg  2 mg Intramuscular TID PRN Onuoha, Chinwendu V, NP       haloperidol  lactate (HALDOL ) injection 10 mg  10 mg Intramuscular TID PRN Onuoha, Chinwendu V, NP       And   diphenhydrAMINE  (BENADRYL ) injection 50 mg  50 mg Intramuscular TID PRN Onuoha, Chinwendu V, NP       And   LORazepam  (ATIVAN ) injection 2 mg  2 mg Intramuscular TID PRN Onuoha, Chinwendu V, NP       hydrOXYzine  (ATARAX ) tablet 25 mg  25 mg Oral TID PRN Onuoha, Chinwendu V, NP   25 mg at 03/18/23 2108   magnesium  hydroxide (MILK OF MAGNESIA) suspension 30 mL  30 mL Oral Daily PRN Onuoha, Chinwendu V, NP       traZODone  (DESYREL ) tablet 50 mg  50 mg Oral QHS PRN Onuoha, Chinwendu V, NP   50 mg at 03/18/23 2108   PTA Medications: Medications Prior to Admission  Medication Sig Dispense Refill Last Dose/Taking   ARIPiprazole   (ABILIFY ) 5 MG tablet Take 1 tablet (5 mg total) by mouth daily. (Patient not taking: Reported on 03/17/2023) 30 tablet 2    divalproex  (DEPAKOTE ) 500 MG DR tablet Take 500 mg by mouth at bedtime. (Patient not taking: Reported on 03/17/2023)      FLUoxetine  (PROZAC ) 20 MG capsule Take 1 capsule (20 mg total) by mouth daily. (Patient not taking: Reported on 03/17/2023) 30 capsule 2     Patient Stressors: Educational concerns   Loss of relationship   Other: unable to follow through    Patient Strengths: Ability for insight  Average or above average intelligence  Capable of independent living  Communication skills  Motivation for treatment/growth  Physical Health  Supportive family/friends  Work skills   Treatment Modalities: Medication Management, Group therapy, Case management,  1 to 1 session with clinician, Psychoeducation, Recreational therapy.   Physician Treatment Plan for Primary Diagnosis: MDD (major depressive disorder), recurrent, severe, with psychosis (HCC) Long Term Goal(s): Improvement in symptoms so as ready for discharge   Short Term Goals: Ability to identify and develop effective coping behaviors will improve Ability to maintain clinical measurements within normal limits will improve Compliance with prescribed medications will improve Ability to identify triggers associated with substance abuse/mental health  issues will improve Ability to identify changes in lifestyle to reduce recurrence of condition will improve Ability to verbalize feelings will improve Ability to disclose and discuss suicidal ideas Ability to demonstrate self-control will improve  Medication Management: Evaluate patient's response, side effects, and tolerance of medication regimen.  Therapeutic Interventions: 1 to 1 sessions, Unit Group sessions and Medication administration.  Evaluation of Outcomes: Not Progressing  Physician Treatment Plan for Secondary Diagnosis: Principal Problem:   MDD  (major depressive disorder), recurrent, severe, with psychosis (HCC)  Long Term Goal(s): Improvement in symptoms so as ready for discharge   Short Term Goals: Ability to identify and develop effective coping behaviors will improve Ability to maintain clinical measurements within normal limits will improve Compliance with prescribed medications will improve Ability to identify triggers associated with substance abuse/mental health issues will improve Ability to identify changes in lifestyle to reduce recurrence of condition will improve Ability to verbalize feelings will improve Ability to disclose and discuss suicidal ideas Ability to demonstrate self-control will improve     Medication Management: Evaluate patient's response, side effects, and tolerance of medication regimen.  Therapeutic Interventions: 1 to 1 sessions, Unit Group sessions and Medication administration.  Evaluation of Outcomes: Not Progressing   RN Treatment Plan for Primary Diagnosis: MDD (major depressive disorder), recurrent, severe, with psychosis (HCC) Long Term Goal(s): Knowledge of disease and therapeutic regimen to maintain health will improve  Short Term Goals: Ability to remain free from injury will improve, Ability to verbalize frustration and anger appropriately will improve, Ability to demonstrate self-control, Ability to participate in decision making will improve, Ability to verbalize feelings will improve, Ability to disclose and discuss suicidal ideas, Ability to identify and develop effective coping behaviors will improve, and Compliance with prescribed medications will improve  Medication Management: RN will administer medications as ordered by provider, will assess and evaluate patient's response and provide education to patient for prescribed medication. RN will report any adverse and/or side effects to prescribing provider.  Therapeutic Interventions: 1 on 1 counseling sessions, Psychoeducation,  Medication administration, Evaluate responses to treatment, Monitor vital signs and CBGs as ordered, Perform/monitor CIWA, COWS, AIMS and Fall Risk screenings as ordered, Perform wound care treatments as ordered.  Evaluation of Outcomes: Not Progressing   LCSW Treatment Plan for Primary Diagnosis: MDD (major depressive disorder), recurrent, severe, with psychosis (HCC) Long Term Goal(s): Safe transition to appropriate next level of care at discharge, Engage patient in therapeutic group addressing interpersonal concerns.  Short Term Goals: Engage patient in aftercare planning with referrals and resources, Increase social support, Increase ability to appropriately verbalize feelings, Increase emotional regulation, Facilitate acceptance of mental health diagnosis and concerns, Facilitate patient progression through stages of change regarding substance use diagnoses and concerns, Identify triggers associated with mental health/substance abuse issues, and Increase skills for wellness and recovery  Therapeutic Interventions: Assess for all discharge needs, 1 to 1 time with Social worker, Explore available resources and support systems, Assess for adequacy in community support network, Educate family and significant other(s) on suicide prevention, Complete Psychosocial Assessment, Interpersonal group therapy.  Evaluation of Outcomes: Not Progressing   Progress in Treatment: Attending groups: Yes. Participating in groups: Yes. Taking medication as prescribed: None scheduled currently Toleration medication: None scheduled currently  Family/Significant other contact made: No, will contact:  consents pending Patient understands diagnosis: Yes. Discussing patient identified problems/goals with staff: Yes. Medical problems stabilized or resolved: Yes. Denies suicidal/homicidal ideation: Yes. Issues/concerns per patient self-inventory: No.  New problem(s) identified: No, Describe:  none  New Short  Term/Long Term Goal(s): medication stabilization, elimination of SI thoughts, development of comprehensive mental wellness plan.    Patient Goals:  Increase my motivation and be more consistent with my medications and going to therapy  Discharge Plan or Barriers: Patient recently admitted. CSW will continue to follow and assess for appropriate referrals and possible discharge planning.    Reason for Continuation of Hospitalization: Depression Medication stabilization Suicidal ideation  Estimated Length of Stay: 5-7 days  Last 3 [place] Suicide Severity Risk Score: Flowsheet Row [facility] (Current[facility]17[facility]2023 in [facility]  C-SSRS RISK CATEGORY High Risk High Risk Error: Q3, 4, or 5 should not be populated when Q2 is No       Last PHQ 2/9 Scores:    03/18/2023    2:43 AM 06/20/2022    8:18 AM  Depression screen PHQ 2/9  Decreased Interest 2 0  Down, Depressed, Hopeless 2 1  PHQ - 2 Score 4 1  Altered sleeping 3   Tired, decreased energy 2   Change in appetite 2   Feeling bad or failure about yourself  2   Trouble concentrating 1   Moving slowly or fidgety/restless 2   Suicidal thoughts 2   PHQ-9 Score 18   Difficult doing work/chores Very difficult     Scribe for Treatment Team: Jenkins LULLA Primer, LCSWA 03/19/2023 12:40 PM

## 2023-03-19 NOTE — BHH Counselor (Signed)
 Adult Comprehensive Assessment  Patient ID: Evelyn Booker, female   DOB: 2000-10-30, 23 y.o.   MRN: [id number]  Information Source: Information source: Patient  Current Stressors:  Patient states their primary concerns and needs for treatment are:: The patient feels like she has no hope, and that she is failing school. She wants to drop out but her mother is putting significant pressure to continue, and has threatened to stop paying her daughers bills if she withdrawls from school. Patient states their goals for this hospitilization and ongoing recovery are:: The patient would like recieve medication stabilization. Educational / Learning stressors: Dietitian, failing classes. Family Relationships: Tenstion with her Mother about her life choices. Financial / Lack of resources (include bankruptcy): Concerned about paying bills  Living/Environment/Situation:  Living Arrangements: Non-relatives/Friends (Lives off campus with roommates) Who else lives in the home?: Off campus housing with roommate How long has patient lived in current situation?: Two years What is atmosphere in current home: Comfortable  Family History:  Marital status: Single Are you sexually active?: No What is your sexual orientation?: Straight Has your sexual activity been affected by drugs, alcohol, medication, or emotional stress?: N/A Does patient have children?: No  Childhood History:  By whom was/is the patient raised?: Mother Description of patient's relationship with caregiver when they were a child: It was okay. Patient's description of current relationship with people who raised him/her: Not great, the patient reports significant tension with her Mother. 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 Has patient ever been sexually abused/assaulted/raped as an adolescent or adult?: No Was the patient ever a victim of a crime or a disaster?: No Witnessed domestic violence?: No Has patient been affected by domestic violence as an adult?: No  Education:  Highest grade of school patient has completed: 1st year undergrade completed. 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?: No  Financial Resources:   Surveyor, Quantity resources: [employer], Income from employment, Support from parents / caregiver Does patient have a lawyer or guardian?: No  Alcohol/Substance Abuse:   What has been your use of drugs/alcohol within the last 12 months?: Marijuana daily If attempted suicide, did drugs/alcohol play a role in this?: No Alcohol/Substance Abuse Treatment Hx: Denies past history Has alcohol/substance abuse ever caused legal problems?: No  Social Support System:   Conservation Officer, Nature Support System: Fair Development Worker, Community Support System: Mother supports her. Type of faith/religion: N/A How does patient's faith help to cope with current illness?: N/A  Leisure/Recreation:   Do You Have Hobbies?: Yes Leisure and Hobbies: Cooking and  traveling  Strengths/Needs:   What is  the patient's perception of their strengths?: Wants to be motivated to do something with her life. Patient states they can use these personal strengths during their treatment to contribute to their recovery: To keep trying to get support and ways to help her succeed in life. Patient states these barriers may affect/interfere with their treatment: The patient feels that she has always been disorganized, has been told she has ADHD but has not been on the proper medication to support. Patient states these barriers may affect their return to the community: None Other important information patient would like considered in planning for their treatment: Wants a new psychiatrist for medication management.  Discharge Plan:   Currently receiving community mental health services: Yes (From Whom) ([facility]  Weekly) Patient states concerns and preferences for aftercare planning are: Needs medication management services Patient states they will know when they are safe and ready for discharge when: SI and HI symptoms go away. Does patient have access to transportation?: Yes Does patient have financial barriers related to discharge medications?: No Will patient be returning to same living situation after discharge?: Yes  Summary/Recommendations:   Summary and Recommendations (to be completed by the evaluator): Evelyn Booker is a 23 y.o. female history of suicide attempts, homicidal ideation, bipolar 2, GAD presented with suicidal ideation for the past few months.  Patient states that she has not been taking her medications at home which include Depakote , Abilify , Prozac  as she forgets and does not restart them when she forgets.  Patient states that she daydreams by driving off the road and may be getting a gun.  Patient has not been seeing her psychiatrist.  Patient reported to CSW that she has HI towards her ex boyfriend who previously took out a 50b order on  her. It has now expired. The patient feels that she would be able to get a gun easily and would harm him if she felt that she was going to follow through on harming herself. The patient is stressed over school, and wants to withdrawl. However her Mother is threating to stop paying for things if she does so. The patient smokes marijuana daily. Patient will benefit from crisis stabilization, medication evaluation, group therapy and psychoeducation, in addition to case management for discharge planning. At discharge it is recommended that Patient adhere to the established discharge plan and continue in treatment.     Anticipated outcomes:     Mood will be stabilized, crisis will be stabilized, medications will be established if appropriate, coping skills will be taught and practiced, family education will be done to provide instructions on safety measures and discharge plan, mental illness will be normalized, discharge appointments will be in place for appropriate level of care at discharge, and patient will be better equipped to recognize symptoms and ask for assistance.  Evelyn Booker L Gilmore List. LCSWA 03/19/2023

## 2023-03-20 DIAGNOSIS — F333 Major depressive disorder, recurrent, severe with psychotic symptoms: Secondary | ICD-10-CM

## 2023-03-20 LAB — HIV ANTIBODY (ROUTINE TESTING W REFLEX): HIV Screen 4th Generation wRfx: NONREACTIVE

## 2023-03-20 LAB — VITAMIN D 25 HYDROXY (VIT D DEFICIENCY, FRACTURES): Vit D, 25-Hydroxy: 17.69 ng/mL — ABNORMAL LOW (ref 30–100)

## 2023-03-20 LAB — RPR: RPR Ser Ql: NONREACTIVE

## 2023-03-20 MED ORDER — VITAMIN D (ERGOCALCIFEROL) 1.25 MG (50000 UNIT) PO CAPS
50000.0000 [IU] | ORAL_CAPSULE | ORAL | Status: DC
Start: 2023-03-20 — End: 2023-03-23
  Administered 2023-03-20: 50000 [IU] via ORAL
  Filled 2023-03-20: qty 1

## 2023-03-20 NOTE — Progress Notes (Signed)
   03/20/23 1100  Psych Admission Type (Psych Patients Only)  Admission Status Voluntary  Psychosocial Assessment  Patient Complaints None  Eye Contact Fair  Facial Expression Flat  Affect Appropriate to circumstance  Speech Logical/coherent  Interaction Assertive  Motor Activity Slow  Appearance/Hygiene Unremarkable  Behavior Characteristics Cooperative;Appropriate to situation  Mood Anxious  Thought Process  Coherency WDL  Content WDL  Delusions None reported or observed  Perception WDL  Hallucination None reported or observed  Judgment Limited  Confusion None  Danger to Self  Current suicidal ideation? Denies  Danger to Others  Danger to Others None reported or observed

## 2023-03-20 NOTE — Plan of Care (Signed)
   Problem: Education: Goal: Emotional status will improve Outcome: Progressing Goal: Mental status will improve Outcome: Progressing   Problem: Activity: Goal: Interest or engagement in activities will improve Outcome: Progressing

## 2023-03-20 NOTE — B[facility] Group Notes (Signed)
 B[facility] LCSW Group Therapy Note  Date/Time:  1:30pm- 2:30 pm  Type of Therapy and Topic:  Group Therapy:  Self-Soothing  Participation Level:  Did Not Attend   Description of Group This process group involved a discussion with and between patients about self-soothing.   The difference between healthy and unhealthy coping skills was described, then examples were elicited from group members for healthy and unhealthy self-soothing techniques.  This then was followed by a discussion about self-soothing, what it is, how important it is, and why we choose the coping techniques we choose.  Participants were encouraged to think about self-soothing as necessary and positive.  Therapeutic Goals Patient will identify and describe one healthy and one unhealthy self-soothing technique they often use Patient will participate in generating ideas about healthy self-soothing options for both during this hospital stay and when they return to the community Patients will be supportive of one another and receive support from others Patients will understand the need all humans have to self-soothe  Summary of Patient Progress:  NA   Therapeutic Modalities Brief Solution-Focused Therapy Psychoeducation

## 2023-03-20 NOTE — Progress Notes (Signed)
   03/19/23 2139  Psych Admission Type (Psych Patients Only)  Admission Status Voluntary  Psychosocial Assessment  Patient Complaints Anxiety;Depression  Eye Contact Fair  Facial Expression Flat  Affect Anxious  Speech Logical/coherent  Interaction Assertive  Motor Activity Slow  Appearance/Hygiene Unremarkable  Behavior Characteristics Cooperative;Appropriate to situation  Mood Anxious  Thought Process  Coherency WDL  Content WDL  Delusions None reported or observed  Perception WDL  Hallucination None reported or observed  Judgment Limited  Confusion None  Danger to Self  Current suicidal ideation? Denies  Agreement Not to Harm Self Yes  Description of Agreement verbal  Danger to Others  Danger to Others None reported or observed

## 2023-03-20 NOTE — [facility] Group Notes (Signed)
Pt did not attend goals group. 

## 2023-03-20 NOTE — Progress Notes (Signed)
 [facility] MD Progress Note  03/20/2023 1:45 PM Evelyn Booker  MRN:  [id number]  Subjective: This is the second psychiatric admission in this [facility] for this 23 year old AA female with hx of bipolar-2 disorder & generalized anxiety disorder. Exa was a patient in this [facility] in April last year, 2024 with similar complaints (worsening depression triggering suicidal ideations with plans). She was treated at the time, stabilized & discharged with an outpatient psychiatric recommendations for routine psychiatric care & medication management.   Daily notes: Yuliza is seen, chart reviewed. The chart findings discussed with the treatment team. She reports, I'm feeling fine today. My depression comes & goes.  I did take something for sleep last night, but I still stayed-up for a long time. That could be the reason I feel so tired this morning. Guest currently denies any SIHI, AVH, delusional thoughts or paranoia.She does not appear to be responding to any internal stimuli. There are no changes made on the current of care. Will continue as already in progress. Vital signs remains stable.  Principal Problem: MDD (major depressive disorder), recurrent, severe, with psychosis (HCC)  Diagnosis: Principal Problem:   MDD (major depressive disorder), recurrent, severe, with psychosis (HCC)  Total Time spent with patient: 45 minutes  Past Psychiatric History: See H&P  Past Medical History:  Past Medical History:  Diagnosis Date   ADHD    Anxiety    Depression     Past Surgical History:  Procedure Laterality Date   BREAST BIOPSY     Family History: History reviewed. No pertinent family history.  Family Psychiatric  History: See H&P.  Social History:  Social History   Substance and Sexual Activity  Alcohol Use Not Currently   Comment: rarely     Social History   Substance and Sexual Activity  Drug Use Yes   Types: Marijuana    Social History   Socioeconomic History   Marital status: Single     Spouse name: Not on file   Number of children: Not on file   Years of education: Not on file   Highest education level: Not on file  Occupational History   Not on file  Tobacco Use   Smoking status: Never   Smokeless tobacco: Never  Vaping Use   Vaping status: Some Days   Substances: THC  Substance and Sexual Activity   Alcohol use: Not Currently    Comment: rarely   Drug use: Yes    Types: Marijuana   Sexual activity: Not Currently  Other Topics Concern   Not on file  Social History Narrative   Not on file   Social Drivers of Health   Financial Resource Strain: Not on file  Food Insecurity: No Food Insecurity (03/18/2023)   Hunger Vital Sign    Worried About Running Out of Food in the Last Year: Never true    Ran Out of Food in the Last Year: Never true  Transportation Needs: No Transportation Needs (03/18/2023)   PRAPARE - Administrator, Civil Service (Medical): No    Lack of Transportation (Non-Medical): No  Physical Activity: Not on file  Stress: Not on file  Social Connections: Not on file   Additional Social History:   Sleep: Good  Appetite:  Good  Current Medications: Current Facility-Administered Medications  Medication Dose Route Frequency Provider Last Rate Last Admin   acetaminophen  (TYLENOL ) tablet 650 mg  650 mg Oral Q6H PRN Onuoha, Chinwendu V, NP  alum & mag hydroxide-simeth (MAALOX/MYLANTA) 200-200-20 MG/5ML suspension 30 mL  30 mL Oral Q4H PRN Onuoha, Chinwendu V, NP       ARIPiprazole  (ABILIFY ) tablet 5 mg  5 mg Oral Daily Savina Olshefski I, NP   5 mg at 03/20/23 9194   haloperidol  (HALDOL ) tablet 5 mg  5 mg Oral TID PRN Onuoha, Chinwendu V, NP       And   diphenhydrAMINE  (BENADRYL ) capsule 50 mg  50 mg Oral TID PRN Onuoha, Chinwendu V, NP       haloperidol  lactate (HALDOL ) injection 5 mg  5 mg Intramuscular TID PRN Onuoha, Chinwendu V, NP       And   diphenhydrAMINE  (BENADRYL ) injection 50 mg  50 mg Intramuscular TID PRN Onuoha,  Chinwendu V, NP       And   LORazepam  (ATIVAN ) injection 2 mg  2 mg Intramuscular TID PRN Onuoha, Chinwendu V, NP       haloperidol  lactate (HALDOL ) injection 10 mg  10 mg Intramuscular TID PRN Onuoha, Chinwendu V, NP       And   diphenhydrAMINE  (BENADRYL ) injection 50 mg  50 mg Intramuscular TID PRN Onuoha, Chinwendu V, NP       And   LORazepam  (ATIVAN ) injection 2 mg  2 mg Intramuscular TID PRN Onuoha, Chinwendu V, NP       FLUoxetine  (PROZAC ) capsule 20 mg  20 mg Oral Daily Kempton Milne I, NP   20 mg at 03/20/23 0805   hydrOXYzine  (ATARAX ) tablet 25 mg  25 mg Oral TID PRN Onuoha, Chinwendu V, NP   25 mg at 03/19/23 2117   magnesium  hydroxide (MILK OF MAGNESIA) suspension 30 mL  30 mL Oral Daily PRN Onuoha, Chinwendu V, NP       nicotine  (NICODERM CQ  - dosed in mg/24 hours) patch 21 mg  21 mg Transdermal Daily Cleda Imel I, NP   21 mg at 03/20/23 0800   traZODone  (DESYREL ) tablet 50 mg  50 mg Oral QHS PRN Onuoha, Chinwendu V, NP   50 mg at 03/19/23 2117   Vitamin D  (Ergocalciferol ) (DRISDOL ) 1.25 MG (50000 UNIT) capsule 50,000 Units  50,000 Units Oral Q7 days Parker, Alvin S, MD   50,000 Units at 03/20/23 1212   Lab Results:  Results for orders placed or performed during the hospital encounter of 03/18/23 (from the past 48 hours)  Folate     Status: None   Collection Time: 03/19/23  6:29 PM  Result Value Ref Range   Folate 21.3 >5.9 ng/mL    Comment: Performed at [facility], [address], [place], [place] [address]  Vitamin B12     Status: None   Collection Time: 03/19/23  6:29 PM  Result Value Ref Range   Vitamin B-12 375 180 - 914 pg/mL    Comment: (NOTE) This assay is not validated for testing neonatal or myeloproliferative syndrome specimens for Vitamin B12 levels. Performed at [facility], [address], [place], [place] [address]   VITAMIN D  25 Hydroxy (Vit-D Deficiency, Fractures)     Status: Abnormal   Collection Time: 03/19/23   6:29 PM  Result Value Ref Range   Vit D, 25-Hydroxy 17.69 (L) 30 - 100 ng/mL    Comment: (NOTE) Vitamin D  deficiency has been defined by the Institute of Medicine  and an Endocrine Society practice guideline as a level of serum 25-OH  vitamin D  less than 20 ng/mL (1,2). The Endocrine Society went on  to  further define vitamin D  insufficiency as a level between 21 and 29  ng/mL (2).  1. IOM (Institute of Medicine). 2010. Dietary reference intakes for  calcium and D. [place]: [employer]. 2. Holick MF, Binkley [place], Bischoff-Ferrari HA, et al. Evaluation,  treatment, and prevention of vitamin D  deficiency: an Endocrine  Society clinical practice guideline, JCEM. 2011 Jul; 96(7): 1911-30.  Performed at [facility], [address] [place], [place] [address]   HIV Antibody (routine testing w rflx)     Status: None   Collection Time: 03/19/23  6:29 PM  Result Value Ref Range   HIV Screen 4th Generation wRfx Non Reactive Non Reactive    Comment: Performed at [facility], [address], [place], [place] [address]   Blood Alcohol level:  Lab Results  Component Value Date   [facility] <10 03/17/2023   ETH <10 06/02/2022   Metabolic Disorder Labs: Lab Results  Component Value Date   HGBA1C 5.2 06/04/2022   MPG 102.54 06/04/2022   No results found for: PROLACTIN Lab Results  Component Value Date   CHOL 174 06/04/2022   TRIG 83 06/04/2022   HDL 53 06/04/2022   CHOLHDL 3.3 06/04/2022   VLDL 17 06/04/2022   LDLCALC 104 (H) 06/04/2022   Physical Findings: AIMS:  , ,  ,  ,    CIWA:    COWS:     Musculoskeletal: Strength & Muscle Tone: within normal limits Gait & Station: normal Patient leans: N/A  Psychiatric Specialty Exam:  Presentation  General Appearance:  Casual; Fairly Groomed  Eye Contact: Good  Speech: Clear and Coherent; Normal Rate  Speech Volume: Normal  Handedness: Right   Mood and Affect  Mood: Hopeless;  Depressed  Affect: Congruent   Thought Process  Thought Processes: Coherent; Goal Directed; Linear  Descriptions of Associations:Intact  Orientation:Full (Time, Place and Person)  Thought Content:Logical  History of Schizophrenia/Schizoaffective disorder:No  Duration of Psychotic Symptoms:No data recorded Hallucinations:Hallucinations: None  Ideas of Reference:None  Suicidal Thoughts:Suicidal Thoughts: No  Homicidal Thoughts:Homicidal Thoughts: No  Sensorium  Memory: Immediate Good; Recent Good; Remote Good  Judgment: Poor  Insight: Poor  Executive Functions  Concentration: Fair  Attention Span: Fair  Recall: Fair  Fund of Knowledge: Fair  Language: Good  Psychomotor Activity  Psychomotor Activity: Psychomotor Activity: Normal  Assets  Assets: Communication Skills; Desire for Improvement; Housing; Physical Health; Resilience; Social Support  Sleep  Sleep: Sleep: Good Number of Hours of Sleep: 7.5  Physical Exam: Physical Exam Vitals and nursing note reviewed.  Cardiovascular:     Rate and Rhythm: Normal rate.     Pulses: Normal pulses.  Pulmonary:     Effort: Pulmonary effort is normal.  Genitourinary:    Comments: Deferred Musculoskeletal:     Cervical back: Normal range of motion.  Neurological:     General: No focal deficit present.     Mental Status: She is alert and oriented to person, place, and time.    Review of Systems  Constitutional:  Negative for chills and fever.  HENT:  Negative for congestion and sore throat.   Respiratory:  Negative for cough, shortness of breath and wheezing.   Cardiovascular:  Negative for chest pain and palpitations.  Gastrointestinal:  Negative for abdominal pain, blood in stool, constipation, diarrhea, heartburn, nausea and vomiting.  Musculoskeletal:  Negative for joint pain and myalgias.  Neurological:  Negative for dizziness, tingling, tremors, sensory change, speech change, focal  weakness, seizures, loss of consciousness, weakness and headaches.  Endo/Heme/Allergies:        NKDA  Psychiatric/Behavioral:  Positive for depression. Negative for hallucinations, memory loss, substance abuse and suicidal ideas. The patient is not nervous/anxious and does not have insomnia.    Blood pressure 121/83, pulse 88, temperature 98.1 F (36.7 C), temperature source Oral, resp. rate 12, height 5' 8 (1.727 m), weight 106.6 kg, SpO2 100%. Body mass index is 35.73 kg/m.  Treatment Plan Summary: Daily contact with patient to assess and evaluate symptoms and progress in treatment and Medication management.   Principal/active diagnoses: MDD (major depressive disorder), recurrent, severe, with psychosis.  Plan: The risks/benefits/side-effects/alternatives to the medications in use were discussed in detail with the patient and time was given for patient's questions. The patient consents to medication trial.    Resumed home medications: -Continue Abilify  5 mg po daily for mood control.  -Fluoxetine  20 mg po daily for depression.  -Continue Hydroxyzine  25 mg po tid prn for anxiety. -Continue Trazodone  50 mg po Q hs prn for insomnia.  -Continue Nicoderm patch 21 mg po prn for anxiety.    Agitation protocols.   --Haldol  5 mg, oral, 3 times daily as needed, mild agitation --Benadryl  50 mg, oral, 3 times daily as needed, mild agitation                                     OR --Haldol  injection 5 mg, IM, 3 times daily as needed, moderate agitation --Benadryl  injection 50 mg, IM, 3 times daily as needed, moderate agitation --Ativan  injection 2 mg, IM, 3 times daily as needed, moderate agitation                                     OR --Haldol  injection 10 mg, IM, 3 times daily as needed, severe agitation --Benadryl  injection 50 mg, IM, 3 times daily as needed, severe agitation --Ativan  injection 2 mg, IM, 3 times daily as needed, severe agitation    Other PRNS -Continue Tylenol  650 mg  every 6 hours PRN for mild pain -Continue Maalox 30 ml Q 4 hrs PRN for indigestion -Continue MOM 30 ml po Q 6 hrs for constipation   Mac Bolster, NP, pmhnp, fnp-bc 03/20/2023, 1:45 PM

## 2023-03-20 NOTE — Progress Notes (Signed)
   03/20/23 2000  Psych Admission Type (Psych Patients Only)  Admission Status Voluntary  Psychosocial Assessment  Patient Complaints Anxiety  Eye Contact Fair  Facial Expression Animated  Affect Appropriate to circumstance  Speech Logical/coherent  Interaction Assertive  Motor Activity Other (Comment) (safety/Standard)  Appearance/Hygiene Unremarkable  Behavior Characteristics Cooperative  Mood Anxious  Thought Process  Coherency WDL  Content WDL  Delusions None reported or observed  Perception WDL  Hallucination None reported or observed  Judgment Limited  Confusion None  Danger to Self  Current suicidal ideation? Denies  Description of Suicide Plan None  Agreement Not to Harm Self Yes  Description of Agreement Verbal Contract for safety  Danger to Others  Danger to Others None reported or observed

## 2023-03-20 NOTE — Plan of Care (Signed)
   Problem: Education: Goal: Knowledge of [facility] Education information/materials will improve Outcome: Progressing Goal: Emotional status will improve Outcome: Progressing Goal: Mental status will improve Outcome: Progressing Goal: Verbalization of understanding the information provided will improve Outcome: Progressing   Problem: Activity: Goal: Interest or engagement in activities will improve Outcome: Progressing

## 2023-03-20 NOTE — Group Note (Signed)
 Date:  03/20/2023 Time:  8:53 PM  Group Topic/Focus:  Wrap-Up Group:   The focus of this group is to help patients review their daily goal of treatment and discuss progress on daily workbooks.    Participation Level:  Active  Participation Quality:  Appropriate and Attentive  Affect:  Appropriate  Cognitive:  Alert and Appropriate  Insight: Appropriate and Good  Engagement in Group:  Engaged  Modes of Intervention:  Discussion and Education  Additional Comments:  Pt attended and participated in wrap up group this evening and rated their day an 8/10. Pt stated that they slept well and is reading more. Tomorrow pt would like to work on discovering their triggers, so that they can learn coping skills for their triggers.   Evelyn Booker Pouch 03/20/2023, 8:53 PM

## 2023-03-20 NOTE — [facility] Group Notes (Signed)
 Pt did not participate in group activity

## 2023-03-21 DIAGNOSIS — F333 Major depressive disorder, recurrent, severe with psychotic symptoms: Secondary | ICD-10-CM | POA: Diagnosis not present

## 2023-03-21 MED ORDER — ARIPIPRAZOLE 5 MG PO TABS
5.0000 mg | ORAL_TABLET | Freq: Every day | ORAL | Status: AC
  Administered 2023-03-21: 5 mg via ORAL
  Filled 2023-03-21: qty 1

## 2023-03-21 MED ORDER — ARIPIPRAZOLE 10 MG PO TABS
10.0000 mg | ORAL_TABLET | Freq: Every day | ORAL | Status: DC
  Administered 2023-03-22 – 2023-03-23 (×2): 10 mg via ORAL
  Filled 2023-03-21 (×4): qty 1

## 2023-03-21 NOTE — Plan of Care (Signed)
   Problem: Education: Goal: Mental status will improve Outcome: Progressing Goal: Verbalization of understanding the information provided will improve Outcome: Progressing   Problem: Activity: Goal: Interest or engagement in activities will improve Outcome: Progressing

## 2023-03-21 NOTE — Progress Notes (Signed)
 [facility] MD Progress Note  03/21/2023 2:26 PM Evelyn Booker  MRN:  [id number]  Subjective: This is the second psychiatric admission in this [facility] for this 23 year old AA female with hx of bipolar-2 disorder & generalized anxiety disorder. Evelyn Booker was a patient in this [facility] in April last year, 2024 with similar complaints (worsening depression triggering suicidal ideations with plans). She was treated at the time, stabilized & discharged with an outpatient psychiatric recommendations for routine psychiatric care & medication management.   Daily notes: Evelyn Booker is seen in her room. She is lying down in bed. She reports that her mood is improving some. She continues to take her medications without any side effects reported. Her Abilify  was increased to10 mg po daily starting today. Patient is tolerating her treatment regimen. Patient currently denies any SIHI, AVH, delusional thoughts or paranoia. She does not appear to be responding to any internal stimuli. There are no changes made on the current of care. Will continue as already in progress. Vital signs remains stable.  Principal Problem: MDD (major depressive disorder), recurrent, severe, with psychosis (HCC)  Diagnosis: Principal Problem:   MDD (major depressive disorder), recurrent, severe, with psychosis (HCC)  Total Time spent with patient: 45 minutes  Past Psychiatric History: See H&P  Past Medical History:  Past Medical History:  Diagnosis Date   ADHD    Anxiety    Depression     Past Surgical History:  Procedure Laterality Date   BREAST BIOPSY     Family History: History reviewed. No pertinent family history.  Family Psychiatric  History: See H&P.  Social History:  Social History   Substance and Sexual Activity  Alcohol Use Not Currently   Comment: rarely     Social History   Substance and Sexual Activity  Drug Use Yes   Types: Marijuana    Social History   Socioeconomic History   Marital status: Single    Spouse name:  Not on file   Number of children: Not on file   Years of education: Not on file   Highest education level: Not on file  Occupational History   Not on file  Tobacco Use   Smoking status: Never   Smokeless tobacco: Never  Vaping Use   Vaping status: Some Days   Substances: THC  Substance and Sexual Activity   Alcohol use: Not Currently    Comment: rarely   Drug use: Yes    Types: Marijuana   Sexual activity: Not Currently  Other Topics Concern   Not on file  Social History Narrative   Not on file   Social Drivers of Health   Financial Resource Strain: Not on file  Food Insecurity: No Food Insecurity (03/18/2023)   Hunger Vital Sign    Worried About Running Out of Food in the Last Year: Never true    Ran Out of Food in the Last Year: Never true  Transportation Needs: No Transportation Needs (03/18/2023)   PRAPARE - Administrator, Civil Service (Medical): No    Lack of Transportation (Non-Medical): No  Physical Activity: Not on file  Stress: Not on file  Social Connections: Not on file   Additional Social History:   Sleep: Good  Appetite:  Good  Current Medications: Current Facility-Administered Medications  Medication Dose Route Frequency Provider Last Rate Last Admin   acetaminophen  (TYLENOL ) tablet 650 mg  650 mg Oral Q6H PRN Onuoha, Chinwendu V, NP       alum & mag hydroxide-simeth (  MAALOX/MYLANTA) 200-200-20 MG/5ML suspension 30 mL  30 mL Oral Q4H PRN Onuoha, Chinwendu V, NP       [START ON 03/22/2023] ARIPiprazole  (ABILIFY ) tablet 10 mg  10 mg Oral Daily Parker, Alvin S, MD       ARIPiprazole  (ABILIFY ) tablet 5 mg  5 mg Oral QHS Kennyth Starleen RAMAN, MD       haloperidol  (HALDOL ) tablet 5 mg  5 mg Oral TID PRN Onuoha, Chinwendu V, NP       And   diphenhydrAMINE  (BENADRYL ) capsule 50 mg  50 mg Oral TID PRN Onuoha, Chinwendu V, NP       haloperidol  lactate (HALDOL ) injection 5 mg  5 mg Intramuscular TID PRN Onuoha, Chinwendu V, NP       And   diphenhydrAMINE   (BENADRYL ) injection 50 mg  50 mg Intramuscular TID PRN Onuoha, Chinwendu V, NP       And   LORazepam  (ATIVAN ) injection 2 mg  2 mg Intramuscular TID PRN Onuoha, Chinwendu V, NP       haloperidol  lactate (HALDOL ) injection 10 mg  10 mg Intramuscular TID PRN Onuoha, Chinwendu V, NP       And   diphenhydrAMINE  (BENADRYL ) injection 50 mg  50 mg Intramuscular TID PRN Onuoha, Chinwendu V, NP       And   LORazepam  (ATIVAN ) injection 2 mg  2 mg Intramuscular TID PRN Onuoha, Chinwendu V, NP       FLUoxetine  (PROZAC ) capsule 20 mg  20 mg Oral Daily Jeyden Coffelt I, NP   20 mg at 03/21/23 0853   hydrOXYzine  (ATARAX ) tablet 25 mg  25 mg Oral TID PRN Onuoha, Chinwendu V, NP   25 mg at 03/20/23 2101   magnesium  hydroxide (MILK OF MAGNESIA) suspension 30 mL  30 mL Oral Daily PRN Onuoha, Chinwendu V, NP   30 mL at 03/20/23 1810   nicotine  (NICODERM CQ  - dosed in mg/24 hours) patch 21 mg  21 mg Transdermal Daily Sadi Arave, Mac I, NP   21 mg at 03/21/23 0853   traZODone  (DESYREL ) tablet 50 mg  50 mg Oral QHS PRN Onuoha, Chinwendu V, NP   50 mg at 03/20/23 2100   Vitamin D  (Ergocalciferol ) (DRISDOL ) 1.25 MG (50000 UNIT) capsule 50,000 Units  50,000 Units Oral Q7 days Parker, Alvin S, MD   50,000 Units at 03/20/23 1212   Lab Results:  Results for orders placed or performed during the hospital encounter of 03/18/23 (from the past 48 hours)  Folate     Status: None   Collection Time: 03/19/23  6:29 PM  Result Value Ref Range   Folate 21.3 >5.9 ng/mL    Comment: Performed at [facility], [address], [place], [place] [address]  RPR     Status: None   Collection Time: 03/19/23  6:29 PM  Result Value Ref Range   RPR Ser Ql NON REACTIVE NON REACTIVE    Comment: Performed at [facility], [address] [place], [place] [address]  Vitamin B12     Status: None   Collection Time: 03/19/23  6:29 PM  Result Value Ref Range   Vitamin B-12 375 180 - 914 pg/mL    Comment: (NOTE) This assay  is not validated for testing neonatal or myeloproliferative syndrome specimens for Vitamin B12 levels. Performed at [facility], [address] [place], [place] [address]   VITAMIN D  25 Hydroxy (Vit-D Deficiency, Fractures)     Status: Abnormal  Collection Time: 03/19/23  6:29 PM  Result Value Ref Range   Vit D, 25-Hydroxy 17.69 (L) 30 - 100 ng/mL    Comment: (NOTE) Vitamin D  deficiency has been defined by the Institute of Medicine  and an Endocrine Society practice guideline as a level of serum 25-OH  vitamin D  less than 20 ng/mL (1,2). The Endocrine Society went on to  further define vitamin D  insufficiency as a level between 21 and 29  ng/mL (2).  1. IOM (Institute of Medicine). 2010. Dietary reference intakes for  calcium and D. [place]: [employer]. 2. Holick MF, Binkley [place], Bischoff-Ferrari HA, et al. Evaluation,  treatment, and prevention of vitamin D  deficiency: an Endocrine  Society clinical practice guideline, JCEM. 2011 Jul; 96(7): 1911-30.  Performed at [facility], [address], [place], [place] [address]   HIV Antibody (routine testing w rflx)     Status: None   Collection Time: 03/19/23  6:29 PM  Result Value Ref Range   HIV Screen 4th Generation wRfx Non Reactive Non Reactive    Comment: Performed at [facility], [address], [place], [place] [address]   Blood Alcohol level:  Lab Results  Component Value Date   [facility] <10 03/17/2023   ETH <10 06/02/2022   Metabolic Disorder Labs: Lab Results  Component Value Date   HGBA1C 5.2 06/04/2022   MPG 102.54 06/04/2022   No results found for: PROLACTIN Lab Results  Component Value Date   CHOL 174 06/04/2022   TRIG 83 06/04/2022   HDL 53 06/04/2022   CHOLHDL 3.3 06/04/2022   VLDL 17 06/04/2022   LDLCALC 104 (H) 06/04/2022   Physical Findings: AIMS:  , ,  ,  ,    CIWA:    COWS:     Musculoskeletal: Strength & Muscle Tone: within normal  limits Gait & Station: normal Patient leans: N/A  Psychiatric Specialty Exam:  Presentation  General Appearance:  Casual; Fairly Groomed  Eye Contact: Good  Speech: Clear and Coherent; Normal Rate  Speech Volume: Normal  Handedness: Right   Mood and Affect  Mood: Depressed  Affect: Congruent   Thought Process  Thought Processes: Coherent; Linear  Descriptions of Associations:Intact  Orientation:Full (Time, Place and Person)  Thought Content:Logical  History of Schizophrenia/Schizoaffective disorder:No  Duration of Psychotic Symptoms: NA Hallucinations:Hallucinations: None   Ideas of Reference:None  Suicidal Thoughts:Suicidal Thoughts: No   Homicidal Thoughts:Homicidal Thoughts: No  Sensorium  Memory: Immediate Good; Recent Good; Remote Good  Judgment: Fair  Insight: Fair  Art Therapist  Concentration: Good  Attention Span: Good  Recall: Good  Fund of Knowledge: Fair  Language: Good  Psychomotor Activity  Psychomotor Activity: Psychomotor Activity: Normal   Assets  Assets: Communication Skills; Desire for Improvement; Financial Resources/Insurance; Housing; Physical Health; Resilience; Social Support  Sleep  Sleep: Sleep: Fair Number of Hours of Sleep: 5.5   Physical Exam: Physical Exam Vitals and nursing note reviewed.  Cardiovascular:     Rate and Rhythm: Normal rate.     Pulses: Normal pulses.  Pulmonary:     Effort: Pulmonary effort is normal.  Genitourinary:    Comments: Deferred Musculoskeletal:     Cervical back: Normal range of motion.  Neurological:     General: No focal deficit present.     Mental Status: She is alert and oriented to person, place, and time.    Review of Systems  Constitutional:  Negative for chills and fever.  HENT:  Negative  for congestion and sore throat.   Respiratory:  Negative for cough, shortness of breath and wheezing.   Cardiovascular:  Negative for chest pain  and palpitations.  Gastrointestinal:  Negative for abdominal pain, blood in stool, constipation, diarrhea, heartburn, nausea and vomiting.  Musculoskeletal:  Negative for joint pain and myalgias.  Neurological:  Negative for dizziness, tingling, tremors, sensory change, speech change, focal weakness, seizures, loss of consciousness, weakness and headaches.  Endo/Heme/Allergies:        NKDA  Psychiatric/Behavioral:  Positive for depression. Negative for hallucinations, memory loss, substance abuse and suicidal ideas. The patient is not nervous/anxious and does not have insomnia.    Blood pressure 104/63, pulse 98, temperature 98.1 F (36.7 C), temperature source Oral, resp. rate 12, height 5' 8 (1.727 m), weight 106.6 kg, SpO2 100%. Body mass index is 35.73 kg/m.  Treatment Plan Summary: Daily contact with patient to assess and evaluate symptoms and progress in treatment and Medication management.   Principal/active diagnoses: MDD (major depressive disorder), recurrent, severe, with psychosis.  Plan: The risks/benefits/side-effects/alternatives to the medications in use were discussed in detail with the patient and time was given for patient's questions. The patient consents to medication trial.    Resumed home medications: -Increased Abilify  to 10 mg mg po daily for mood control.  -Continue Fluoxetine  20 mg po daily for depression.  -Continue Hydroxyzine  25 mg po tid prn for anxiety. -Continue Trazodone  50 mg po Q hs prn for insomnia.  -Continue Nicoderm patch 21 mg po prn for anxiety.    Agitation protocols.  --Haldol  5 mg, oral, 3 times daily as needed, mild agitation --Benadryl  50 mg, oral, 3 times daily as needed, mild agitation                                     OR --Haldol  injection 5 mg, IM, 3 times daily as needed, moderate agitation --Benadryl  injection 50 mg, IM, 3 times daily as needed, moderate agitation --Ativan  injection 2 mg, IM, 3 times daily as needed, moderate  agitation                                     OR --Haldol  injection 10 mg, IM, 3 times daily as needed, severe agitation --Benadryl  injection 50 mg, IM, 3 times daily as needed, severe agitation --Ativan  injection 2 mg, IM, 3 times daily as needed, severe agitation    Other PRNS -Continue Tylenol  650 mg every 6 hours PRN for mild pain -Continue Maalox 30 ml Q 4 hrs PRN for indigestion -Continue MOM 30 ml po Q 6 hrs for constipation   Mac Bolster, NP, pmhnp, fnp-bc 03/21/2023, 2:26 PM Patient ID: Evelyn Booker, female   DOB: 30-Aug-2000, 22 y.o.   MRN: [id number]

## 2023-03-21 NOTE — Group Note (Signed)
 Date:  03/21/2023 Time:  2:27 PM  Group Topic/Focus:  Goals Group:   The focus of this group is to help patients establish daily goals to achieve during treatment and discuss how the patient can incorporate goal setting into their daily lives to aide in recovery. Orientation:   The focus of this group is to educate the patient on the purpose and policies of crisis stabilization and provide a format to answer questions about their admission.  The group details unit policies and expectations of patients while admitted.    Participation Level:  Did Not Attend  Participation Quality:   n/a  Affect:   n/a  Cognitive:   n/a  Insight: None  Engagement in Group:   n/a  Modes of Intervention:   n/a  Additional Comments:   Pt did not attend.  Addison HERO Helma Argyle 03/21/2023, 2:27 PM

## 2023-03-21 NOTE — Progress Notes (Addendum)
 D. Pt has been appropriate during interactions- visible in the milieu, observed attending groups. Per pt's self inventory,pt rated her depression,hopelessness and anxiety all 0's. Pt's stated goal today is to make a plan to go home. Pt currently denies SI/HI and AVH   A. Labs and vitals monitored. Pt given and educated on medications. Pt supported emotionally and encouraged to express concerns and ask questions.   R. Pt remains safe with 15 minute checks. Will continue POC.    03/21/23 1400  Psych Admission Type (Psych Patients Only)  Admission Status Voluntary  Psychosocial Assessment  Patient Complaints None  Eye Contact Fair  Facial Expression Animated  Affect Appropriate to circumstance  Speech Logical/coherent  Interaction Assertive  Motor Activity Other (Comment) (level 3 observation)  Appearance/Hygiene Unremarkable  Behavior Characteristics Cooperative;Appropriate to situation  Mood Pleasant  Thought Process  Coherency WDL  Content WDL  Delusions None reported or observed  Perception WDL  Hallucination None reported or observed  Judgment Limited  Confusion None  Danger to Self  Current suicidal ideation? Denies  Danger to Others  Danger to Others None reported or observed

## 2023-03-21 NOTE — [facility] Group Notes (Signed)
 [facility] Group Notes:  (Nursing/MHT/Case Management/Adjunct)  Date:  03/21/2023  Time:  8:25 PM  Type of Therapy:   Wrap-up group  Participation Level:  Did Not Attend  Participation Quality:    Affect:    Cognitive:    Insight:    Engagement in Group:    Modes of Intervention:    Summary of Progress/Problems: Pt refused to attend group.  Evelyn Booker 03/21/2023, 8:25 PM

## 2023-03-21 NOTE — Group Note (Signed)
 Date:  03/22/2023 Time:  11:28 AM  Group Topic/Focus:  Emotional Education:   The focus of this group is to discuss what feelings/emotions are, and how they are experienced. Managing Feelings:   The focus of this group is to identify what feelings patients have difficulty handling and develop a plan to handle them in a healthier way upon discharge.    Participation Level:  Did Not Attend  Participation Quality:   n/a  Affect:   n/a  Cognitive:   n/a  Insight: None  Engagement in Group:   n/a  Modes of Intervention:   n/a  Additional Comments:   Pt did not attend.  Addison HERO Shaleigh Laubscher 03/22/2023, 11:28 AM

## 2023-03-22 ENCOUNTER — Telehealth (HOSPITAL_COMMUNITY): Payer: BC Managed Care – PPO | Admitting: Psychiatry

## 2023-03-22 DIAGNOSIS — F333 Major depressive disorder, recurrent, severe with psychotic symptoms: Secondary | ICD-10-CM | POA: Diagnosis not present

## 2023-03-22 NOTE — Group Note (Signed)
 Date:  03/22/2023 Time:  11:48 AM  Group Topic/Focus:  Goals Group:   The focus of this group is to help patients establish daily goals to achieve during treatment and discuss how the patient can incorporate goal setting into their daily lives to aide in recovery. Orientation:   The focus of this group is to educate the patient on the purpose and policies of crisis stabilization and provide a format to answer questions about their admission.  The group details unit policies and expectations of patients while admitted.    Participation Level:  Did Not Attend  Participation Quality:   n/a  Affect:   n/a  Cognitive:   n/a  Insight: None  Engagement in Group:   n/a  Modes of Intervention:   n/a  Additional Comments:   Pt did not attend.   [name] 03/22/2023, 11:48 AM

## 2023-03-22 NOTE — Group Note (Signed)
 Occupational Therapy Group Note  Group Topic: Sleep Hygiene  Group Date: 03/22/2023 Start Time: 1427 End Time: 1455 Facilitators: Lynnda Sas, OT   Group Description: Group encouraged increased participation and engagement through topic focused on sleep hygiene. Patients reflected on the quality of sleep they typically receive and identified areas that need improvement. Group was given background information on sleep and sleep hygiene, including common sleep disorders. Group members also received information on how to improve one's sleep and introduced a sleep diary as a tool that can be utilized to track sleep quality over a length of time. Group session ended with patients identifying one or more strategies they could utilize or implement into their sleep routine in order to improve overall sleep quality.        Therapeutic Goal(s):  Identify one or more strategies to improve overall sleep hygiene  Identify one or more areas of sleep that are negatively impacted (sleep too much, too little, etc)     Participation Level: Engaged   Participation Quality: Independent   Behavior: Appropriate   Speech/Thought Process: Relevant   Affect/Mood: Appropriate   Insight: Fair   Judgement: Fair      Modes of Intervention: Education  Patient Response to Interventions:  Attentive   Plan: Continue to engage patient in OT groups 2 - 3x/week.  03/22/2023  Lynnda Sas, OT  Miyuki Rzasa, OT

## 2023-03-22 NOTE — [facility] Group Notes (Signed)
 [facility] Group Notes:  (Nursing/MHT/Case Management/Adjunct)  Date:  03/22/2023  Time:  9:35 PM  Type of Therapy:  Psychoeducational Skills  Participation Level:  Minimal  Participation Quality:  Attentive  Affect:  Appropriate  Cognitive:  Appropriate  Insight:  Appropriate  Engagement in Group:  Limited  Modes of Intervention:  Education  Summary of Progress/Problems: Patient attended the evening A.A.speakers meeting and was appropriate.   Yetzali Weld S 03/22/2023, 9:35 PM

## 2023-03-22 NOTE — Plan of Care (Signed)
  Problem: Safety: Goal: Periods of time without injury will increase Outcome: Progressing   Problem: Activity: Goal: Interest or engagement in activities will improve Outcome: Progressing

## 2023-03-22 NOTE — Progress Notes (Addendum)
 [facility] MD Progress Note  03/22/2023 6:14 PM Evelyn Booker  MRN:  [id number]  HPI: This is the second psychiatric admission in this [facility] for this 23 year old AA female with hx of bipolar-2 disorder & generalized anxiety disorder. Evelyn Booker was a patient in this [facility] in April last year, 2024 with similar complaints (worsening depression triggering suicidal ideations with plans). She was treated at the time, stabilized & discharged with an outpatient psychiatric recommendations for routine psychiatric care & medication management.   Patient Assessment: Pt was seen in her room laying down in bed. On approach pt sat up in bed. Pt's affect was flat and mood depressed. Pt reports her sleep and appetite are good. Pt endorses low anxiety and depression, however, she reports passive SI with no plan or intent. Pt reports that one of her protective factors is her pet cat named Aurora. Patient denies HI, AVH, paranoia, and delusional thoughts. Pt also is not responding to internal stimuli. Pt has been compliant with her scheduled medications and reports no side effects. Vital signs are WNL and she denies any pain. Pt is responding to medication, with decreased symptoms. No medication adjustments today. Estimated discharge: 03/23/23 or 03/24/23, pending completed safety plan by CSW.  24 hr chart review: Sleep Hours last night: 7.25 hrs per nursing, and pt also reports a good sleep quality last night. Nursing Concerns: None reported Behavioral episodes in the past 24 hrs: None  Medication Compliance: Compliant  Vital Signs in the past 24 hrs: WNL PRN Medications in the past 24 hrs: Trazodone and Tylenol   Principal Problem: MDD (major depressive disorder), recurrent, severe, with psychosis (HCC)  Diagnosis: Principal Problem:   MDD (major depressive disorder), recurrent, severe, with psychosis (HCC)  Total Time spent with patient: 45 minutes  Past Psychiatric History: See H&P  Past Medical History:  Past  Medical History:  Diagnosis Date   ADHD    Anxiety    Depression     Past Surgical History:  Procedure Laterality Date   BREAST BIOPSY     Family History: History reviewed. No pertinent family history.  Family Psychiatric  History: See H&P.  Social History:  Social History   Substance and Sexual Activity  Alcohol Use Not Currently   Comment: rarely     Social History   Substance and Sexual Activity  Drug Use Yes   Types: Marijuana    Social History   Socioeconomic History   Marital status: Single    Spouse name: Not on file   Number of children: Not on file   Years of education: Not on file   Highest education level: Not on file  Occupational History   Not on file  Tobacco Use   Smoking status: Never   Smokeless tobacco: Never  Vaping Use   Vaping status: Some Days   Substances: THC  Substance and Sexual Activity   Alcohol use: Not Currently    Comment: rarely   Drug use: Yes    Types: Marijuana   Sexual activity: Not Currently  Other Topics Concern   Not on file  Social History Narrative   Not on file   Social Drivers of Health   Financial Resource Strain: Not on file  Food Insecurity: No Food Insecurity (03/18/2023)   Hunger Vital Sign    Worried About Running Out of Food in the Last Year: Never true    Ran Out of Food in the Last Year: Never true  Transportation Needs: No Transportation Needs (  03/18/2023)   PRAPARE - Administrator, Civil Service (Medical): No    Lack of Transportation (Non-Medical): No  Physical Activity: Not on file  Stress: Not on file  Social Connections: Not on file   Additional Social History:   Sleep: Good  Appetite:  Good  Current Medications: Current Facility-Administered Medications  Medication Dose Route Frequency Provider Last Rate Last Admin   acetaminophen (TYLENOL) tablet 650 mg  650 mg Oral Q6H PRN Onuoha, Chinwendu V, NP   650 mg at 03/21/23 2219   alum & mag hydroxide-simeth (MAALOX/MYLANTA)  200-200-20 MG/5ML suspension 30 mL  30 mL Oral Q4H PRN Onuoha, Chinwendu V, NP       ARIPiprazole (ABILIFY) tablet 10 mg  10 mg Oral Daily Golda Acre, MD   10 mg at 03/22/23 0820   haloperidol (HALDOL) tablet 5 mg  5 mg Oral TID PRN Onuoha, Chinwendu V, NP       And   diphenhydrAMINE (BENADRYL) capsule 50 mg  50 mg Oral TID PRN Onuoha, Chinwendu V, NP       haloperidol lactate (HALDOL) injection 5 mg  5 mg Intramuscular TID PRN Onuoha, Chinwendu V, NP       And   diphenhydrAMINE (BENADRYL) injection 50 mg  50 mg Intramuscular TID PRN Onuoha, Chinwendu V, NP       And   LORazepam (ATIVAN) injection 2 mg  2 mg Intramuscular TID PRN Onuoha, Chinwendu V, NP       haloperidol lactate (HALDOL) injection 10 mg  10 mg Intramuscular TID PRN Onuoha, Chinwendu V, NP       And   diphenhydrAMINE (BENADRYL) injection 50 mg  50 mg Intramuscular TID PRN Onuoha, Chinwendu V, NP       And   LORazepam (ATIVAN) injection 2 mg  2 mg Intramuscular TID PRN Onuoha, Chinwendu V, NP       FLUoxetine (PROZAC) capsule 20 mg  20 mg Oral Daily Nwoko, Agnes I, NP   20 mg at 03/22/23 0820   hydrOXYzine (ATARAX) tablet 25 mg  25 mg Oral TID PRN Onuoha, Chinwendu V, NP   25 mg at 03/20/23 2101   magnesium hydroxide (MILK OF MAGNESIA) suspension 30 mL  30 mL Oral Daily PRN Onuoha, Chinwendu V, NP   30 mL at 03/20/23 1810   nicotine (NICODERM CQ - dosed in mg/24 hours) patch 21 mg  21 mg Transdermal Daily Nwoko, Agnes I, NP   21 mg at 03/22/23 0820   traZODone (DESYREL) tablet 50 mg  50 mg Oral QHS PRN Onuoha, Chinwendu V, NP   50 mg at 03/21/23 2121   Vitamin D (Ergocalciferol) (DRISDOL) 1.25 MG (50000 UNIT) capsule 50,000 Units  50,000 Units Oral Q7 days Golda Acre, MD   50,000 Units at 03/20/23 1212   Lab Results:  No results found for this or any previous visit (from the past 48 hours).  Blood Alcohol level:  Lab Results  Component Value Date   ETH <10 03/17/2023   ETH <10 06/02/2022   Metabolic Disorder  Labs: Lab Results  Component Value Date   HGBA1C 5.2 06/04/2022   MPG 102.54 06/04/2022   No results found for: "PROLACTIN" Lab Results  Component Value Date   CHOL 174 06/04/2022   TRIG 83 06/04/2022   HDL 53 06/04/2022   CHOLHDL 3.3 06/04/2022   VLDL 17 06/04/2022   LDLCALC 104 (H) 06/04/2022   Physical Findings: AIMS:  , ,  ,  ,  CIWA:    COWS:     Musculoskeletal: Strength & Muscle Tone: within normal limits Gait & Station: normal Patient leans: N/A  Psychiatric Specialty Exam:  Presentation  General Appearance:  Fairly Groomed  Eye Contact: Fair  Speech: Clear and Coherent  Speech Volume: Normal  Handedness: Right   Mood and Affect  Mood: Depressed; Anxious  Affect: Congruent   Thought Process  Thought Processes: Coherent  Descriptions of Associations:Intact  Orientation:Full (Time, Place and Person)  Thought Content:Logical  History of Schizophrenia/Schizoaffective disorder:No  Duration of Psychotic Symptoms: NA Hallucinations:Hallucinations: None   Ideas of Reference:None  Suicidal Thoughts: Passive, with no plan or intent   Homicidal Thoughts:Homicidal Thoughts: No  Sensorium  Memory: Immediate Good  Judgment: Fair  Insight: Fair  Art therapist  Concentration: Fair  Attention Span: Fair  Recall: Good  Fund of Knowledge: Fair  Language: Good  Psychomotor Activity  Psychomotor Activity: Psychomotor Activity: Normal   Assets  Assets: Resilience  Sleep  Sleep: Sleep: Good Number of Hours of Sleep: 7.25   Physical Exam: Physical Exam Vitals and nursing note reviewed.  Cardiovascular:     Rate and Rhythm: Normal rate.     Pulses: Normal pulses.  Pulmonary:     Effort: Pulmonary effort is normal.  Genitourinary:    Comments: Deferred Musculoskeletal:     Cervical back: Normal range of motion.  Neurological:     General: No focal deficit present.     Mental Status: She is alert  and oriented to person, place, and time.  Psychiatric:        Attention and Perception: Attention normal.        Mood and Affect: Affect is flat.        Speech: Speech normal.        Behavior: Behavior is cooperative.        Thought Content: Thought content normal.        Cognition and Memory: Cognition normal.        Judgment: Judgment normal.    Review of Systems  Constitutional:  Negative for chills and fever.  HENT:  Negative for congestion and sore throat.   Respiratory:  Negative for cough, shortness of breath and wheezing.   Cardiovascular:  Negative for chest pain and palpitations.  Gastrointestinal:  Negative for abdominal pain, blood in stool, constipation, diarrhea, heartburn, nausea and vomiting.  Musculoskeletal:  Negative for joint pain and myalgias.  Neurological:  Negative for dizziness, tingling, tremors, sensory change, speech change, focal weakness, seizures, loss of consciousness, weakness and headaches.  Endo/Heme/Allergies:        NKDA  Psychiatric/Behavioral:  Positive for depression. Negative for hallucinations, memory loss, substance abuse and suicidal ideas. The patient is not nervous/anxious and does not have insomnia.    Blood pressure 121/86, pulse 82, temperature 98.2 F (36.8 C), temperature source Oral, resp. rate 12, height 5\' 8"  (1.727 m), weight 106.6 kg, SpO2 100%. Body mass index is 35.73 kg/m.  Treatment Plan Summary: Daily contact with patient to assess and evaluate symptoms and progress in treatment and Medication management.   Principal/active diagnoses: MDD (major depressive disorder), recurrent, severe, with psychosis.  Plan: The risks/benefits/side-effects/alternatives to the medications in use were discussed in detail with the patient and time was given for patient's questions. The patient consents to medication trial.    Continue to resume home medications: -Continue Abilify to 10 mg mg po daily for mood control.  -Continue Fluoxetine  20 mg po daily for depression.  -Continue  Hydroxyzine 25 mg po tid prn for anxiety. -Continue Trazodone 50 mg po Q hs prn for insomnia.  -Continue Nicoderm patch 21 mg daily for smoking cessation.    Agitation protocols.  --Haldol 5 mg, oral, 3 times daily as needed, mild agitation --Benadryl 50 mg, oral, 3 times daily as needed, mild agitation                                     OR --Haldol injection 5 mg, IM, 3 times daily as needed, moderate agitation --Benadryl injection 50 mg, IM, 3 times daily as needed, moderate agitation --Ativan injection 2 mg, IM, 3 times daily as needed, moderate agitation                                     OR --Haldol injection 10 mg, IM, 3 times daily as needed, severe agitation --Benadryl injection 50 mg, IM, 3 times daily as needed, severe agitation --Ativan injection 2 mg, IM, 3 times daily as needed, severe agitation    Other PRNS -Continue Tylenol 650 mg every 6 hours PRN for mild pain -Continue Maalox 30 ml Q 4 hrs PRN for indigestion -Continue MOM 30 ml po Q 6 hrs for constipation   Starleen Blue, NP 03/22/2023, 6:14 PM

## 2023-03-22 NOTE — Plan of Care (Signed)

## 2023-03-22 NOTE — Progress Notes (Signed)
   03/22/23 0000  Psych Admission Type (Psych Patients Only)  Admission Status Voluntary  Psychosocial Assessment  Eye Contact Fair  Facial Expression Animated  Affect Appropriate to circumstance  Speech Logical/coherent  Interaction Assertive  Motor Activity Other (Comment) (safety/Standard)  Appearance/Hygiene Unremarkable  Behavior Characteristics Cooperative;Appropriate to situation  Mood Pleasant  Thought Process  Coherency WDL  Content WDL  Delusions None reported or observed  Perception WDL  Hallucination None reported or observed  Judgment Limited  Confusion None  Danger to Self  Current suicidal ideation? Denies  Description of Suicide Plan None  Agreement Not to Harm Self Yes  Description of Agreement Verbal Contract for safety  Danger to Others  Danger to Others None reported or observed

## 2023-03-22 NOTE — Progress Notes (Signed)
 D: Patient is alert, oriented, pleasant, and cooperative. Denies SI, HI, AVH, and verbally contracts for safety. Patient denies physical symptoms/pain.    A: Scheduled medications administered per MD order. Support provided. Patient educated on safety on the unit and medications. Routine safety checks every 15 minutes. Patient stated understanding to tell nurse about any new physical symptoms. Patient understands to tell staff of any needs.     R: No adverse drug reactions noted. Patient remains safe at this time and will continue to monitor.    03/22/23 1000  Psych Admission Type (Psych Patients Only)  Admission Status Voluntary  Psychosocial Assessment  Patient Complaints None  Eye Contact Fair  Facial Expression Animated  Affect Appropriate to circumstance  Speech Logical/coherent  Interaction Assertive  Motor Activity Other (Comment) (WNL)  Appearance/Hygiene Unremarkable  Behavior Characteristics Cooperative;Appropriate to situation  Mood Pleasant  Thought Process  Coherency WDL  Content WDL  Delusions None reported or observed  Perception WDL  Hallucination None reported or observed  Judgment Limited  Confusion None  Danger to Self  Current suicidal ideation? Denies  Agreement Not to Harm Self Yes  Description of Agreement verbal  Danger to Others  Danger to Others None reported or observed

## 2023-03-22 NOTE — Progress Notes (Signed)
   03/22/23 2154  Psych Admission Type (Psych Patients Only)  Admission Status Voluntary  Psychosocial Assessment  Patient Complaints None  Eye Contact Fair  Facial Expression Animated  Affect Appropriate to circumstance  Speech Logical/coherent  Interaction Assertive  Motor Activity Other (Comment) (WDL)  Appearance/Hygiene Unremarkable  Behavior Characteristics Cooperative  Mood Pleasant  Thought Process  Coherency WDL  Content WDL  Delusions None reported or observed  Perception WDL  Hallucination None reported or observed  Judgment Impaired  Confusion None  Danger to Self  Current suicidal ideation? Denies  Agreement Not to Harm Self Yes  Description of Agreement verbal  Danger to Others  Danger to Others None reported or observed

## 2023-03-22 NOTE — Plan of Care (Signed)

## 2023-03-22 NOTE — Group Note (Signed)
 Date:  03/22/2023 Time:  4:38 PM  Group Topic/Focus:  Healthy Communication:   The focus of this group is to discuss communication, barriers to communication, as well as healthy ways to communicate with others. Managing Feelings:   The focus of this group is to identify what feelings patients have difficulty handling and develop a plan to handle them in a healthier way upon discharge.    Participation Level:  Did Not Attend  Participation Quality:   n/a  Affect:   n/a  Cognitive:   n/a  Insight: None  Engagement in Group:   n/a  Modes of Intervention:   n/a  Additional Comments:   Pt did not attend.   Shade Darby Wilhelmina Hark 03/22/2023, 4:38 PM

## 2023-03-22 NOTE — Group Note (Signed)
 Recreation Therapy Group Note   Group Topic:Stress Management  Group Date: 03/22/2023 Start Time: 0935 End Time: 0954 Facilitators: Poppy Mcafee-McCall, LRT,CTRS Location: [address] Dayroom   Group Topic: Stress Management   Goal Area(s) Addresses:  Patient will identify positive stress management techniques. Patient will identify benefits of using stress management post d/c.   Intervention: Insight Timer App   Activity: Meditation. LRT played a meditation that focused on reducing stress, anxiety and worry. Patients were to focus on their breathing and allow the meditation to relax and calm them in order to get the full affect of the meditation.     Education:  Stress Management, Discharge Planning.    Education Outcome: Acknowledges Education   Affect/Mood: N/A   Participation Level: Did not attend    Clinical Observations/Individualized Feedback:      Plan: Continue to engage patient in RT group sessions 2-3x/week.   London Tarnowski-McCall, LRT,CTRS  03/22/2023 11:36 AM

## 2023-03-22 NOTE — B[facility] Group Notes (Signed)
 Spiritual care group on grief and loss facilitated by Chaplain Nick Barman, Bcc  Group Goal: Support / Education around grief and loss  Members engage in facilitated group support and psycho-social education.  Group Description:  Following introductions and group rules, group members engaged in facilitated group dialogue and support around topic of loss, with particular support around experiences of loss in their lives. Group Identified types of loss (relationships / self / things) and identified patterns, circumstances, and changes that precipitate losses. Reflected on thoughts / feelings around loss, normalized grief responses, and recognized variety in grief experience. Group encouraged individual reflection on safe space and on the coping skills that they are already utilizing.  Group drew on Adlerian / Rogerian and narrative framework  Patient Progress: Evelyn Booker attended group. Verbal participation was minimal, but she demonstrated engagement in the conversation.

## 2023-03-23 DIAGNOSIS — F333 Major depressive disorder, recurrent, severe with psychotic symptoms: Secondary | ICD-10-CM | POA: Diagnosis not present

## 2023-03-23 MED ORDER — TRAZODONE HCL 50 MG PO TABS: 50.0000 mg | ORAL_TABLET | Freq: Every evening | ORAL | 0 refills | Status: DC | PRN

## 2023-03-23 MED ORDER — ARIPIPRAZOLE 10 MG PO TABS: 10.0000 mg | ORAL_TABLET | Freq: Every day | ORAL | 0 refills | Status: DC

## 2023-03-23 MED ORDER — VITAMIN D (ERGOCALCIFEROL) 1.25 MG (50000 UNIT) PO CAPS: 50000.0000 [IU] | ORAL_CAPSULE | ORAL | 0 refills | Status: AC

## 2023-03-23 MED ORDER — HYDROXYZINE HCL 25 MG PO TABS
25.0000 mg | ORAL_TABLET | Freq: Three times a day (TID) | ORAL | 0 refills | Status: AC | PRN
Start: 2023-03-23 — End: ?

## 2023-03-23 MED ORDER — NICOTINE 21 MG/24HR TD PT24: 21.0000 mg | MEDICATED_PATCH | Freq: Every day | TRANSDERMAL | 0 refills | Status: AC

## 2023-03-23 NOTE — Group Note (Signed)
LCSW Group Therapy Note   Group Date: 03/23/2023 Start Time: 1100 End Time: 1200   Participation:  patient was present and was respectful, but didn't participate in the conversation  Title:  Healing Hearts: A Safe Space for Grief  Objective: The objective of this class is to create a compassionate environment where participants can process their grief, explore different stages of grief, and discover ways to honor their loved ones through personal rituals. 3 Goals: Provide a safe and supportive space where participants feel comfortable sharing their feelings and experiences of grief without judgment. Educate participants about the stages of grief and emphasize that there is no "right" way to grieve or a fixed timeline for healing. Introduce the concept of rituals as a means to process grief, allowing individuals to honor their loved ones in a personal and meaningful way.  Summary: In Healing Hearts: A Safe Space for Grief, we explored the unique and personal journey of grief, emphasizing that everyone experiences it differently.  We discussed the five stages of grief (denial, anger, bargaining, depression, and acceptance), with the understanding that grief is not linear.  Rituals were introduced as a way to help cope with loss, offering comfort and connection through meaningful actions such as lighting candles or taking memory walks.  Participants were encouraged to express their emotions, focus on self-care, and reflect on moments of gratitude for their loved ones, recognizing that healing is a process and there is no timeline for grief.  Therapeutic Modalities:  Elements of CBT (cognitive restructuring)  Elements of DBT (mindfulness, distress tolerance, radical acceptance)    Evelyn Booker Evelyn Booker, LCSWA 03/23/2023  12:19 PM

## 2023-03-23 NOTE — Progress Notes (Signed)
   03/23/23 0600  15 Minute Checks  Location Bedroom  Visual Appearance Calm  Behavior Sleeping  Sleep (Behavioral Health Patients Only)  Calculate sleep? (Click Yes once per 24 hr at 0600 safety check) Yes  Documented sleep last 24 hours 8

## 2023-03-23 NOTE — Progress Notes (Signed)
  [facility] Adult Case Management Discharge Plan :  Will you be returning to the same living situation after discharge:  Yes,  home with mother. At discharge, do you have transportation home?: Yes,  mother [name] to pick up patient at 11AM Do you have the ability to pay for your medications: Yes,  confirmed [employer].  Release of information consent forms completed and in the chart;  Patient's signature needed at discharge.  Patient to Follow up at:  Follow-up Information     [facility] Follow up on 03/31/2023.   Specialty: Behavioral Health Why: You have an appointment for medication management services on 03/31/23 at 10 am with Gilmore Laroche, Virtual via [facility] .  You also have an appointment for therapy services on 04/13/23 at 9:00 am, in person, with Coolidge Breeze. Contact information: [address] [place] [address] [phone]                Next level of care provider has access to [facility] Link:yes  Safety Planning and Suicide Prevention discussed: Yes,  completed with mother, [name] ([phone]) on 03/23/2023   Has patient been referred to the Quitline?: Patient refused referral for treatment  Patient has been referred for addiction treatment: No known substance use disorder.  Jacinta Shoe, LCSW 03/23/2023, 9:38 AM

## 2023-03-23 NOTE — Plan of Care (Signed)
Problem: Education: Goal: Emotional status will improve Outcome: Progressing Goal: Mental status will improve Outcome: Progressing   Problem: Activity: Goal: Interest or engagement in activities will improve Outcome: Progressing Goal: Sleeping patterns will improve Outcome: Progressing   Problem: Coping: Goal: Ability to demonstrate self-control will improve Outcome: Progressing

## 2023-03-23 NOTE — Discharge Summary (Signed)
Physician Discharge Summary Note  Patient:  Evelyn Booker is an 23 y.o., female MRN:  [id number] DOB:  22-Jul-2000 Patient phone:  [phone] (home)  Patient address:   [address] [place] [address],  Total Time spent with patient: {Time; 15 min - 8 hours:17441}  Date of Admission:  03/18/2023 Date of Discharge: ***  Reason for Admission:  ***  Principal Problem: MDD (major depressive disorder), recurrent, severe, with psychosis (HCC) Discharge Diagnoses: Principal Problem:   MDD (major depressive disorder), recurrent, severe, with psychosis (HCC)   Past Psychiatric History: ***  Past Medical History:  Past Medical History:  Diagnosis Date   ADHD    Anxiety    Depression     Past Surgical History:  Procedure Laterality Date   BREAST BIOPSY     Family History: History reviewed. No pertinent family history. Family Psychiatric  History: *** Social History:  Social History   Substance and Sexual Activity  Alcohol Use Not Currently   Comment: rarely     Social History   Substance and Sexual Activity  Drug Use Yes   Types: Marijuana    Social History   Socioeconomic History   Marital status: Single    Spouse name: Not on file   Number of children: Not on file   Years of education: Not on file   Highest education level: Not on file  Occupational History   Not on file  Tobacco Use   Smoking status: Never   Smokeless tobacco: Never  Vaping Use   Vaping status: Some Days   Substances: THC  Substance and Sexual Activity   Alcohol use: Not Currently    Comment: rarely   Drug use: Yes    Types: Marijuana   Sexual activity: Not Currently  Other Topics Concern   Not on file  Social History Narrative   Not on file   Social Drivers of Health   Financial Resource Strain: Not on file  Food Insecurity: No Food Insecurity (03/18/2023)   Hunger Vital Sign    Worried About Running Out of Food in the Last Year: Never true    Ran Out of Food in  the Last Year: Never true  Transportation Needs: No Transportation Needs (03/18/2023)   PRAPARE - Administrator, Civil Service (Medical): No    Lack of Transportation (Non-Medical): No  Physical Activity: Not on file  Stress: Not on file  Social Connections: Not on file    Hospital Course:  ***  Physical Findings: AIMS:  , ,  ,  ,    CIWA:    COWS:     Musculoskeletal: Strength & Muscle Tone: {desc; muscle tone:32375} Gait & Station: {PE GAIT ED EXBM:84132} Patient leans: {Patient Leans:21022755}   Psychiatric Specialty Exam:  Presentation  General Appearance:  Fairly Groomed  Eye Contact: Fair  Speech: Clear and Coherent  Speech Volume: Normal  Handedness: Right   Mood and Affect  Mood: Depressed; Anxious  Affect: Congruent   Thought Process  Thought Processes: Coherent  Descriptions of Associations:Intact  Orientation:Full (Time, Place and Person)  Thought Content:Logical  History of Schizophrenia/Schizoaffective disorder:No  Duration of Psychotic Symptoms:No data recorded Hallucinations:Hallucinations: None  Ideas of Reference:None  Suicidal Thoughts:Suicidal Thoughts: No  Homicidal Thoughts:Homicidal Thoughts: No   Sensorium  Memory: Immediate Good  Judgment: Fair  Insight: Fair   Art therapist  Concentration: Fair  Attention Span: Fair  Recall: Good  Fund of Knowledge: Fair  Language: Good   Psychomotor Activity  Psychomotor Activity: Psychomotor Activity: Normal   Assets  Assets: Resilience   Sleep  Sleep: Sleep: Fair    Physical Exam: Physical Exam ROS Blood pressure 119/86, pulse 93, temperature 98.5 F (36.9 C), temperature source Oral, resp. rate 12, height 5\' 8"  (1.727 m), weight 106.6 kg, SpO2 99%. Body mass index is 35.73 kg/m.   Social History   Tobacco Use  Smoking Status Never  Smokeless Tobacco Never   Tobacco Cessation:  {Discharge tobacco cessation  prescription:304700209}   Blood Alcohol level:  Lab Results  Component Value Date   ETH <10 03/17/2023   ETH <10 06/02/2022    Metabolic Disorder Labs:  Lab Results  Component Value Date   HGBA1C 5.2 06/04/2022   MPG 102.54 06/04/2022   No results found for: "PROLACTIN" Lab Results  Component Value Date   CHOL 174 06/04/2022   TRIG 83 06/04/2022   HDL 53 06/04/2022   CHOLHDL 3.3 06/04/2022   VLDL 17 06/04/2022   LDLCALC 104 (H) 06/04/2022    See Psychiatric Specialty Exam and Suicide Risk Assessment completed by Attending Physician prior to discharge.  Discharge destination:  {DISCHARGE DESTINATION:22616}  Is patient on multiple antipsychotic therapies at discharge:  {RECOMMEND TAPERING:22617}   Has Patient had three or more failed trials of antipsychotic monotherapy by history:  {BHH ANTIPSYCHOTIC:22903}  Recommended Plan for Multiple Antipsychotic Therapies: {BHH MULTIPLE ANTIPSYCHOTIC THERAPIES:22905}   Allergies as of 03/23/2023   No Known Allergies      Medication List     STOP taking these medications    divalproex 500 MG DR tablet Commonly known as: DEPAKOTE   FLUoxetine 20 MG capsule Commonly known as: PROZAC       TAKE these medications      Indication  ARIPiprazole 10 MG tablet Commonly known as: ABILIFY Take 1 tablet (10 mg total) by mouth daily. Start taking on: March 24, 2023 What changed:  medication strength how much to take  Indication: mood stabilization   hydrOXYzine 25 MG tablet Commonly known as: ATARAX Take 1 tablet (25 mg total) by mouth 3 (three) times daily as needed for anxiety.  Indication: Feeling Anxious   nicotine 21 mg/24hr patch Commonly known as: NICODERM CQ - dosed in mg/24 hours Place 1 patch (21 mg total) onto the skin daily. Start taking on: March 24, 2023  Indication: Nicotine Addiction   traZODone 50 MG tablet Commonly known as: DESYREL Take 1 tablet (50 mg total) by mouth at bedtime as needed  for sleep.  Indication: Trouble Sleeping   Vitamin D (Ergocalciferol) 1.25 MG (50000 UNIT) Caps capsule Commonly known as: DRISDOL Take 1 capsule (50,000 Units total) by mouth every 7 (seven) days. Start taking on: March 27, 2023  Indication: Vitamin D Deficiency        Follow-up Information     [facility] Follow up on 03/31/2023.   Specialty: Behavioral Health Why: You have an appointment for medication management services on 03/31/23 at 10 am with Gilmore Laroche, Virtual via [place] .  You also have an appointment for therapy services on 04/13/23 at 9:00 am, in person, with Coolidge Breeze. Contact information: [address] [place] [address] [phone]                Follow-up recommendations:  {BHH DC FU RECOMMENDATIONS:22620}  Comments:  ***  Signed: Starleen Blue, NP 03/23/2023, 9:27 AM

## 2023-03-23 NOTE — BHH Suicide Risk Assessment (Addendum)
BHH INPATIENT:  Family/Significant Other Suicide Prevention Education  Suicide Prevention Education:  Education Completed; [name] [phone]) - mother, has been identified by the patient as the family member/significant other with whom the patient will be residing, and identified as the person(s) who will aid the patient in the event of a mental health crisis (suicidal ideations/suicide attempt).  With written consent from the patient, the family member/significant other has been provided the following suicide prevention education, prior to the and/or following the discharge of the patient.  Mother confirms that she is able to provide transportation for discharge today at 11AM. Treatment team notified.  Met with Pt on the unit to discuss discharge plans. Made her aware of mother's anticipated arrival time. Evelyn Booker states that she feels safe for discharge, with plans to withdraw from school. She states that she is looking forward to returning to work, requesting a work note with return in two days.   The suicide prevention education provided includes the following: Suicide risk factors Suicide prevention and interventions National Suicide Hotline telephone number [facility] assessment telephone number [facility] Emergency Assistance 911 [facility] and/or Residential Mobile Crisis Unit telephone number  Request made of family/significant other to: Remove weapons (e.g., guns, rifles, knives), all items previously/currently identified as safety concern.   Remove drugs/medications (over-the-counter, prescriptions, illicit drugs), all items previously/currently identified as a safety concern.  The family member/significant other verbalizes understanding of the suicide prevention education information provided.  The family member/significant other agrees to remove the items of safety concern listed above.  Joelyn [place] Defina, LCSW 03/23/2023, 9:30 AM

## 2023-03-23 NOTE — Group Note (Signed)
Date:  03/23/2023 Time:  9:24 AM  Group Topic/Focus:  Goals Group:   The focus of this group is to help patients establish daily goals to achieve during treatment and discuss how the patient can incorporate goal setting into their daily lives to aide in recovery. Orientation:   The focus of this group is to educate the patient on the purpose and policies of crisis stabilization and provide a format to answer questions about their admission.  The group details unit policies and expectations of patients while admitted.    Participation Level:  Active  Participation Quality:  Appropriate  Affect:  Appropriate  Cognitive:  Appropriate  Insight: Appropriate  Engagement in Group:  Engaged  Modes of Intervention:  Discussion  Additional Comments:    [name] [name] 03/23/2023, 9:24 AM

## 2023-03-23 NOTE — BHH Suicide Risk Assessment (Signed)
[facility] Discharge Suicide Risk Assessment   Principal Problem: MDD (major depressive disorder), recurrent, severe, with psychosis (HCC) Discharge Diagnoses: Principal Problem:   MDD (major depressive disorder), recurrent, severe, with psychosis (HCC)   Total Time spent with patient: 30 minutes  Musculoskeletal: Strength & Muscle Tone: within normal limits Gait & Station: normal Patient leans: N/A  Psychiatric Specialty Exam  Presentation  General Appearance:  Overweight, casually dressed, not in any distress, good relatedness, appropriate behavior.  No EPS.  Eye Contact: Good.  Speech: Spontaneous.  Normal rate and tone and volume.  Normal prosody of speech.  Handedness: Right   Mood and Affect  Mood: Euthymic  Affect: Full range and mood congruent.  Thought Process  Thought Processes: Linear and goal directed.  Descriptions of Associations:Intact  Orientation:Full (Time, Place and Person)  Thought Content: No negative ruminative thoughts.  No guilty ruminations.  No suicidal thoughts.  No homicidal thoughts.  No thoughts of violence.  No delusional theme.  No obsessions.  Hallucinations: No hallucination in any modality.  Sensorium  Memory: Immediate Good  Judgment: Good  Insight: Good   Executive Functions  Concentration: Good  Attention Span: Good  Recall: Good  Fund of Knowledge: Good  Language: Good   Psychomotor Activity  Normal psychomotor activity  Physical Exam: Physical Exam ROS Blood pressure 119/86, pulse 93, temperature 98.5 F (36.9 C), temperature source Oral, resp. rate 12, height 5\' 8"  (1.727 m), weight 106.6 kg, SpO2 99%. Body mass index is 35.73 kg/m.  Mental Status Per Nursing Assessment::   On Admission:  NA  Demographic Factors:  Adolescent or young adult  Loss Factors: Decrease in vocational status  Historical Factors: Prior suicide attempts  Risk Reduction Factors:   Employed, Living with  another person, especially a relative, Positive social support, Positive therapeutic relationship, and Positive coping skills or problem solving skills  Continued Clinical Symptoms:  Patient has been able to process her psychosocial stressors.  She has decided to take some time off college.  She is going to concentrate on her job and go back to school next semester.  She feels less stressed and no longer feeling overwhelmed.  She is not reporting any residual symptoms of depression.  There are no residual overwhelming anxiety.  Patient is no longer having any feeling of hopelessness or worthlessness.  There are no manic symptoms.  There are no psychotic symptoms.  She is tolerating her medication well.  Cognitive Features That Contribute To Risk:  None    Suicide Risk:  Minimal: Patient is not currently suicidal.  She is not homicidal.  Patient is not endorsing any thoughts of violence.  Modifiable risk factor targeted during this admission as depression and adjustment issues.  Patient has responded well to medication adjustments.  She is no longer feeling depressed.  There are no new psychosocial stressors.  She is resourceful and has come up with ways of getting back to her desired goals.  Patient continues to have support in the community.  She has good relationship with her roommates. At this point in time, patient is not a danger to herself or others.  Patient is stable for care at the lower setting.  Follow-up Information     [facility] Follow up on 03/31/2023.   Specialty: Behavioral Health Why: You have an appointment for medication management services on 03/31/23 at 10 am with Gilmore Laroche, Virtual via [place] .  You also have an appointment for therapy services on 04/13/23  at 9:00 am, in person, with Coolidge Breeze. Contact information: [address] [place] [address] [phone]                Plan Of  Care/Follow-up recommendations:  See discharge summary.  Georgiann Cocker, MD 03/23/2023, 9:27 AM

## 2023-03-23 NOTE — Progress Notes (Addendum)
Patient discharged from [facility] on 03/23/23 at 12:45pm. Patient denies SI, plan, and intention. Suicide safety plan completed, reviewed with this RN, given to the patient, and a copy in the chart. Patient denies HI/AVH upon discharge. Patient rates her depression a 0/10 and her anxiety a 0/10. Patient is alert, oriented, and cooperative. RN provided patient with discharge paperwork and reviewed information with patient. Patient expressed that she understood all of the discharge instructions. Pt was satisfied with belongings returned to her from the locker and at bedside. Discharged patient to [facility] waiting room. Pt's mother awaiting patient in the [facility] waiting room.

## 2023-03-23 NOTE — Progress Notes (Signed)
   03/23/23 0906  Psych Admission Type (Psych Patients Only)  Admission Status Voluntary  Psychosocial Assessment  Patient Complaints Sleep disturbance ("I had some trouble falling asleep last night")  Eye Contact Fair  Facial Expression Flat  Affect Appropriate to circumstance  Speech Logical/coherent  Interaction Assertive  Motor Activity Other (Comment) (WDL)  Appearance/Hygiene Unremarkable  Behavior Characteristics Cooperative;Appropriate to situation  Mood Pleasant  Thought Process  Coherency WDL  Content WDL  Delusions None reported or observed  Perception WDL  Hallucination None reported or observed  Judgment Impaired  Confusion None  Danger to Self  Current suicidal ideation? Denies  Description of Suicide Plan No plan  Agreement Not to Harm Self Yes  Description of Agreement Verbal  Danger to Others  Danger to Others None reported or observed

## 2023-03-31 ENCOUNTER — Telehealth (INDEPENDENT_AMBULATORY_CARE_PROVIDER_SITE_OTHER): Payer: 59 | Admitting: Psychiatry

## 2023-03-31 ENCOUNTER — Encounter (HOSPITAL_COMMUNITY): Payer: Self-pay | Admitting: Psychiatry

## 2023-03-31 DIAGNOSIS — F3181 Bipolar II disorder: Secondary | ICD-10-CM

## 2023-03-31 DIAGNOSIS — F5102 Adjustment insomnia: Secondary | ICD-10-CM

## 2023-03-31 DIAGNOSIS — F411 Generalized anxiety disorder: Secondary | ICD-10-CM | POA: Diagnosis not present

## 2023-03-31 MED ORDER — TRAZODONE HCL 50 MG PO TABS: 50.0000 mg | ORAL_TABLET | Freq: Every evening | ORAL | 0 refills | Status: AC | PRN

## 2023-03-31 MED ORDER — ARIPIPRAZOLE 10 MG PO TABS: 10.0000 mg | ORAL_TABLET | Freq: Every day | ORAL | 0 refills | Status: AC

## 2023-03-31 NOTE — Progress Notes (Signed)
 BHH Follow up visit  Patient Identification: Evelyn Booker MRN:  [id number] Date of Evaluation:  03/31/2023 Referral Source: hospital discharge Chief Complaint:   No chief complaint on file. Follow up depression /hospital discharge Visit Diagnosis:    ICD-10-CM   1. Bipolar 2 disorder, major depressive episode (HCC)  F31.81     2. GAD (generalized anxiety disorder)  F41.1     3. Adjustment insomnia  F51.02     Virtual Visit via Video Note  I connected with Evelyn Booker on 03/31/23 at 10:00 AM EST by a video enabled telemedicine application and verified that I am speaking with the correct person using two identifiers.  Location: Patient: home Provider: home office   I discussed the limitations of evaluation and management by telemedicine and the availability of in person appointments. The patient expressed understanding and agreed to proceed.   I discussed the assessment and treatment plan with the patient. The patient was provided an opportunity to ask questions and all were answered. The patient agreed with the plan and demonstrated an understanding of the instructions.   The patient was advised to call back or seek an in-person evaluation if the symptoms worsen or if the condition fails to improve as anticipated.  I provided 20 minutes of non-face-to-face time during this encounter.   History of Present Illness: Patient is a 23 years old currently single African-American female was living with her mom and stepdad has had  hospital admission because of attempted suicide in  May 2024 friend was called by her and she got admitted She was going to [employer] college. At that time.    She has moved to [facility] and was doing fair last visit, complieance was discussed and to abstain from [facility]  She recently had a re admit to hospital, discharge summary reviewed, worsening of depression with not able to keep with GPA and academic stress also not compliant with meds before  Discharged with  abilify now and is doing fair, depression better Trying to work on her goals and may take gap year or adjust Still uses THC and understands the risk of amotivation and depression with it States not on prozac now Takes trazadone for sleep Still in therapy and helping out to cope Handlng ok with roomates  Aggravating factors: 2024 hospital admission, breakup with BF Modifying factors; mom. Some friends     Previous Psychotropic Medications: Yes   Substance Abuse History in the last 12 months:  Yes.    Consequences of Substance Abuse: THC effect on judjdment and depression discussed, now have reduced to one in two week , priro admission was using daily  Past Medical History:  Past Medical History:  Diagnosis Date   ADHD    Anxiety    Depression     Past Surgical History:  Procedure Laterality Date   BREAST BIOPSY      Family Psychiatric History: denies  Family History: History reviewed. No pertinent family history.  Social History:   Social History   Socioeconomic History   Marital status: Single    Spouse name: Not on file   Number of children: Not on file   Years of education: Not on file   Highest education level: Not on file  Occupational History   Not on file  Tobacco Use   Smoking status: Never   Smokeless tobacco: Never  Vaping Use   Vaping status: Some Days   Substances: THC  Substance and Sexual Activity   Alcohol use: Not Currently  Comment: rarely   Drug use: Yes    Types: Marijuana   Sexual activity: Not Currently  Other Topics Concern   Not on file  Social History Narrative   Not on file   Social Drivers of Health   Financial Resource Strain: Not on file  Food Insecurity: No Food Insecurity (03/18/2023)   Hunger Vital Sign    Worried About Running Out of Food in the Last Year: Never true    Ran Out of Food in the Last Year: Never true  Transportation Needs: No Transportation Needs (03/18/2023)   PRAPARE - Scientist, research (physical sciences) (Medical): No    Lack of Transportation (Non-Medical): No  Physical Activity: Not on file  Stress: Not on file  Social Connections: Not on file     Allergies:  No Known Allergies  Metabolic Disorder Labs: Lab Results  Component Value Date   HGBA1C 5.2 06/04/2022   MPG 102.54 06/04/2022   No results found for: "PROLACTIN" Lab Results  Component Value Date   CHOL 174 06/04/2022   TRIG 83 06/04/2022   HDL 53 06/04/2022   CHOLHDL 3.3 06/04/2022   VLDL 17 06/04/2022   LDLCALC 104 (H) 06/04/2022   Lab Results  Component Value Date   TSH 0.538 06/04/2022    Therapeutic Level Labs: No results found for: "LITHIUM" No results found for: "CBMZ" Lab Results  Component Value Date   VALPROATE 66 06/09/2022    Current Medications: Current Outpatient Medications  Medication Sig Dispense Refill   ARIPiprazole (ABILIFY) 10 MG tablet Take 1 tablet (10 mg total) by mouth daily. 30 tablet 0   hydrOXYzine (ATARAX) 25 MG tablet Take 1 tablet (25 mg total) by mouth 3 (three) times daily as needed for anxiety. 30 tablet 0   nicotine (NICODERM CQ - DOSED IN MG/24 HOURS) 21 mg/24hr patch Place 1 patch (21 mg total) onto the skin daily. 28 patch 0   traZODone (DESYREL) 50 MG tablet Take 1 tablet (50 mg total) by mouth at bedtime as needed for sleep. 30 tablet 0   Vitamin D, Ergocalciferol, (DRISDOL) 1.25 MG (50000 UNIT) CAPS capsule Take 1 capsule (50,000 Units total) by mouth every 7 (seven) days. 4 capsule 0   No current facility-administered medications for this visit.     Psychiatric Specialty Exam: Review of Systems  Cardiovascular:  Negative for chest pain.  Neurological:  Negative for tremors.  Psychiatric/Behavioral:  Negative for self-injury.     There were no vitals taken for this visit.There is no height or weight on file to calculate BMI.  General Appearance: Casual  Eye Contact:  Good  Speech:  Slow  Volume:  Normal  Mood:  euthymic  Affect:   Constricted  Thought Process:  Goal Directed  Orientation:  Full (Time, Place, and Person)  Thought Content:  Rumination  Suicidal Thoughts:  No  Homicidal Thoughts:  No  Memory:  Recent;   Fair  Judgement:  Fair  Insight:  Shallow  Psychomotor Activity:  Normal  Concentration:  Concentration: Fair  Recall:  Fair  Fund of Knowledge:Good  Language: Good  Akathisia:  No  Handed:    AIMS (if indicated):  no involuntary movements  Assets:  Desire for Improvement  ADL's:  Intact  Cognition: WNL  Sleep:  Good   Screenings: AUDIT    Flowsheet Row [facility] Ad[facility] ([facility]) [facility]  Alcohol Use Disorder Identification Test Final Score (AUDIT) 1 0      PHQ2-9    Flowsheet Row ED from [facility]17/2023 in [facility] Office [facility] from [facility]  PHQ-2 Total Score 4 1  PHQ-9 Total Score 18 --      Flowsheet Row [facility]) [facility]20[facility] Visit from [facility]20/2022 [facility]  C-SSRS RISK CATEGORY High Risk High Risk Error: Q3, 4, or 5 should not be populated when Q2 is No       Assessment and Plan: as follows  Prior documentation reviewed    Bipolar disorder, 2; current episode depressed:  Doing fair since discharge, continue abilify Labs reviewed, normal glucose level  Patient understand to keep PCP and require a EKG No tremors or chest pain Weight maintenance discussed   GAD: stress related to college, continue vistaril prn and therapy    Insomnia: on trazadone but to avoid napping during the day  Discussed time management to help with getting chores planned  and done   [facility] use: discussed risks and effect to depression, amotivation Patient to work on abstinence  Fu 1  m.  Collaboration of Care: Other hospital discharge and notes reviewed  Patient/Guardian was advised Release of Information must be obtained prior to any record release in order to collaborate their care with an outside provider. Patient/Guardian was advised if they have not already done so to contact the registration department to sign all necessary forms in order for [place] to release information regarding their care.   Consent: Patient/Guardian gives verbal consent for treatment and assignment of benefits for services provided during this visit. Patient/Guardian expressed understanding and agreed to proceed.   Thresa Ross, MD 2/19/202510:28 AM

## 2023-04-13 ENCOUNTER — Ambulatory Visit (INDEPENDENT_AMBULATORY_CARE_PROVIDER_SITE_OTHER): Payer: Self-pay | Admitting: Licensed Clinical Social Worker

## 2023-04-13 DIAGNOSIS — Z91199 Patient's noncompliance with other medical treatment and regimen due to unspecified reason: Secondary | ICD-10-CM

## 2023-04-13 NOTE — Progress Notes (Signed)
 Patient did not show for assessment

## 2023-04-28 ENCOUNTER — Telehealth (HOSPITAL_COMMUNITY): Payer: 59 | Admitting: Psychiatry
